# Patient Record
Sex: Female | Born: 2000 | Race: Black or African American | Hispanic: No | Marital: Single | Smoking: Never smoker
Health system: Southern US, Community
[De-identification: ages and names within clinical notes are randomized; demographics above are authoritative.]

## PROBLEM LIST (undated history)

## (undated) DIAGNOSIS — D649 Anemia, unspecified: Secondary | ICD-10-CM

## (undated) HISTORY — DX: Anemia, unspecified: D64.9

---
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
## 2022-04-02 ENCOUNTER — Ambulatory Visit (INDEPENDENT_AMBULATORY_CARE_PROVIDER_SITE_OTHER): Payer: Medicaid Other

## 2022-04-02 DIAGNOSIS — Z3201 Encounter for pregnancy test, result positive: Secondary | ICD-10-CM

## 2022-04-02 LAB — POCT PREGNANCY, URINE: Preg Test, Ur: POSITIVE — AB

## 2022-04-02 NOTE — Progress Notes (Signed)
Patient dropped off urine for a pregnancy test. Urine pregnancy test positive. I called patient to review these results with her. Per patient she had a positive pregnancy test at home on April 26th. Patient denies any pain or vaginal bleeding. Per patient her LMP was 02/13/22 making her [redacted]w[redacted]d today with an EDD of 11/20/22. Patient states her OBGYN is here at the [facility] and she would like to receive prenatal care here. New OB appointment scheduled. I recommended patient begin taking a prenatal vitamin. I provided patient with safe medication list via mychart. Patient verbalized understanding and denies any other questions.  ? ?Paulina Fusi, RN ?04/02/22 ?

## 2022-04-02 NOTE — Patient Instructions (Signed)

## 2022-04-17 ENCOUNTER — Telehealth (INDEPENDENT_AMBULATORY_CARE_PROVIDER_SITE_OTHER): Payer: Medicaid Other

## 2022-04-17 DIAGNOSIS — Z3480 Encounter for supervision of other normal pregnancy, unspecified trimester: Secondary | ICD-10-CM | POA: Insufficient documentation

## 2022-04-17 DIAGNOSIS — Z3A00 Weeks of gestation of pregnancy not specified: Secondary | ICD-10-CM

## 2022-04-17 MED ORDER — BLOOD PRESSURE MONITORING DEVI: 1.0000 | 0 refills | Status: DC

## 2022-04-17 MED ORDER — PRENATAL PLUS 27-1 MG PO TABS: 1.0000 | ORAL_TABLET | Freq: Every day | ORAL | 11 refills | Status: DC

## 2022-04-17 NOTE — Patient Instructions (Signed)

## 2022-04-17 NOTE — Progress Notes (Signed)
New OB Intake  I connected with  Ritta Slot on 04/17/22 at  2:15 PM EDT by MyChart Video Visit and verified that I am speaking with the correct person using two identifiers. Nurse is located at [facility] and pt is located at [facility].  I discussed the limitations, risks, security and privacy concerns of performing an evaluation and management service by telephone and the availability of in person appointments. I also discussed with the patient that there may be a patient responsible charge related to this service. The patient expressed understanding and agreed to proceed.  I explained I am completing New OB Intake today. We discussed her EDD of 11/20/22 that is based on LMP of 02/13/22. Pt is G0/P0. I reviewed her allergies, medications, Medical/Surgical/OB history, and appropriate screenings. I informed her of [facility] services. Based on history, this is a/an  pregnancy uncomplicated .   There are no problems to display for this patient.   Concerns addressed today  Delivery Plans:  Plans to deliver at [facility].   MyChart/Babyscripts MyChart access verified. I explained pt will have some visits in office and some virtually. Babyscripts instructions given and order placed. Patient verifies receipt of registration text/e-mail. Account successfully created and app downloaded.  Blood Pressure Cuff  Blood pressure cuff ordered for patient to pick-up from [employer]. Explained after first prenatal appt pt will check weekly and document in Babyscripts.  Weight scale: Patient does / does not  have weight scale. Weight scale ordered for patient to pick up from [employer].   Anatomy US Explained first scheduled [place] will be around 19 weeks. Anatomy US scheduled for 06/26/22 at 0830. Pt notified to arrive at 0815. Scheduled AFP lab only appointment if CenteringPregnancy pt for same day as anatomy US.   Labs Discussed Avelina Laine genetic screening with patient. Would like both [facility] and [facility]  drawn at new OB visit.Also if interested in genetic testing, tell patient she will need AFP 15-21 weeks to complete genetic testing .Routine prenatal labs needed.  Covid Vaccine Patient has not covid vaccine.   Is patient a CenteringPregnancy candidate?  Pt wanted MOM/Baby Declined due to    Not a candidate due to Is patient a CenteringPregnancy candidate?   "Centering Patient" indicated on sticky note   Is patient a Mom+Baby Combined Care candidate?  Accepted    Scheduled with Mom+Baby provider    Is patient interested in Waterbirth?  No   "Interested in [employer] - Schedule next visit with CNM" on sticky note  Informed patient of [facility] website  and placed link in her AVS.   Social Determinants of Health Food Insecurity: Patient denies food insecurity. WIC Referral: Patient is interested in referral to [facility].  Transportation: Patient denies transportation needs. Childcare: Discussed no children allowed at ultrasound appointments. Offered childcare services; patient declines childcare services at this time.  Send link to Pregnancy Navigators   Placed OB Box on problem list and updated  First visit review I reviewed new OB appt with pt. I explained she will have a pelvic exam, ob bloodwork with genetic screening, and PAP smear. Explained pt will be seen by Dr.Eckstat  at first visit; encounter routed to appropriate provider. Explained that patient will be seen by pregnancy navigator following visit with provider. [facility] information placed in AVS.   Henrietta Dine, CMA 04/17/2022  2:25 PM

## 2022-05-16 ENCOUNTER — Encounter: Payer: Self-pay | Admitting: Family Medicine

## 2022-05-16 ENCOUNTER — Ambulatory Visit (INDEPENDENT_AMBULATORY_CARE_PROVIDER_SITE_OTHER): Payer: Medicaid Other | Admitting: Family Medicine

## 2022-05-16 VITALS — BP 126/82 | HR 101 | Wt 112.8 lb

## 2022-05-16 DIAGNOSIS — Z3A13 13 weeks gestation of pregnancy: Secondary | ICD-10-CM

## 2022-05-16 DIAGNOSIS — Z3480 Encounter for supervision of other normal pregnancy, unspecified trimester: Secondary | ICD-10-CM

## 2022-05-16 NOTE — Progress Notes (Signed)
Subjective:   Lori Silva is a 21 y.o. G1P0000 at [redacted]w[redacted]d by sure LMP being seen today for her first obstetrical visit.  Her obstetrical history is significant for  n/a . Patient does intend to breast feed. Pregnancy history fully reviewed.  Patient reports no complaints.  HISTORY: OB History  Gravida Para Term Preterm AB Living  1 0 0 0 0 0  SAB IAB Ectopic Multiple Live Births  0 0 0 0 0    # Outcome Date GA Lbr Len/2nd Weight Sex Delivery Anes PTL Lv  1 Current              Last pap smear: No results found for: "DIAGPAP", "HPV", "HPVHIGH" N/a  Past Medical History:  Diagnosis Date   Anemia    Asthma    History reviewed. No pertinent surgical history. Family History  Problem Relation Age of Onset   Healthy Mother    Social History   Tobacco Use   Smoking status: Never   Smokeless tobacco: Never  Substance Use Topics   Alcohol use: Never   Drug use: Not Currently   Allergies  Allergen Reactions   Fruit & Vegetable Daily [Nutritional Supplements]     ALL fruits    Pumpkin Flavor    Current Outpatient Medications on File Prior to Visit  Medication Sig Dispense Refill   Blood Pressure Monitoring DEVI 1 each by Does not apply route once a week. 1 each 0   Prenatal Vit-Fe Fumarate-FA (MULTIVITAMIN-PRENATAL) 27-0.8 MG TABS tablet Take 1 tablet by mouth daily at 12 noon.     prenatal vitamin w/FE, FA (PRENATAL 1 + 1) 27-1 MG TABS tablet Take 1 tablet by mouth daily at 12 noon. 30 tablet 11   No current facility-administered medications on file prior to visit.     Exam   Vitals:   05/16/22 0845  BP: 126/82  Pulse: (!) 101  Weight: 112 lb 12.8 oz (51.2 kg)   Fetal Heart Rate (bpm): 155  System: General: well-developed, well-nourished female in no acute distress   Skin: normal coloration and turgor, no rashes   Neurologic: oriented, normal, negative, normal mood   Extremities: normal strength, tone, and muscle mass, ROM of all joints is normal    HEENT PERRLA, extraocular movement intact and sclera clear, anicteric   Neck supple and no masses   Respiratory:  no respiratory distress      Assessment:   Pregnancy: G1P0000 Patient Active Problem List   Diagnosis Date Noted   Supervision of other normal pregnancy, antepartum 04/17/2022     Plan:  1. Supervision of other normal pregnancy, antepartum Initial labs drawn. Continue prenatal vitamins. Genetic Screening discussed, NIPS: ordered. Ultrasound discussed; fetal anatomic survey: ordered. Problem list reviewed and updated. The nature of [facility]Family Care clinic was explained to patient; Voiced they may need to be seen by other [facility] providers which includes family medicine physicians, OB GYNs, and APPs. Delivery will hopefully be with one of the [facility] providers or another [facility] Medicine physician and we cannot promise this at this time.  Discussed there are [facility] staff in the hospital 24-7 and they understand and support this model and there is a likelihood one of these providers will catch their baby.  We also discussed that the service includes learners (residents, student) and they will be involved in the care team.     Routine obstetric precautions reviewed. Return in 4 weeks (on 06/13/2022) for [facility] patient, ob visit.

## 2022-05-16 NOTE — Patient Instructions (Signed)
Second Trimester of Pregnancy  The second trimester of pregnancy is from week 13 through week 27. This is months 4 through 6 of pregnancy. The second trimester is often a time when you feel your best. Your body has adjusted to being pregnant, and you begin to feel better physically. During the second trimester: Morning sickness has lessened or stopped completely. You may have more energy. You may have an increase in appetite. The second trimester is also a time when the unborn baby (fetus) is growing rapidly. At the end of the sixth month, the fetus may be up to 12 inches long and weigh about 1 pounds. You will likely begin to feel the baby move (quickening) between 16 and 20 weeks of pregnancy. Body changes during your second trimester Your body continues to go through many changes during your second trimester. The changes vary and generally return to normal after the baby is born. Physical changes Your weight will continue to increase. You will notice your lower abdomen bulging out. You may begin to get stretch marks on your hips, abdomen, and breasts. Your breasts will continue to grow and to become tender. Dark spots or blotches (chloasma or mask of pregnancy) may develop on your face. A dark line from your belly button to the pubic area (linea nigra) may appear. You may have changes in your hair. These can include thickening of your hair, rapid growth, and changes in texture. Some people also have hair loss during or after pregnancy, or hair that feels dry or thin. Health changes You may develop headaches. You may have heartburn. You may develop constipation. You may develop hemorrhoids or swollen, bulging veins (varicose veins). Your gums may bleed and may be sensitive to brushing and flossing. You may urinate more often because the fetus is pressing on your bladder. You may have back pain. This is caused by: Weight gain. Pregnancy hormones that are relaxing the joints in your  pelvis. A shift in weight and the muscles that support your balance. Follow these instructions at home: Medicines Follow your health care provider's instructions regarding medicine use. Specific medicines may be either safe or unsafe to take during pregnancy. Do not take any medicines unless approved by your health care provider. Take a prenatal vitamin that contains at least 600 micrograms (mcg) of folic acid. Eating and drinking Eat a healthy diet that includes fresh fruits and vegetables, whole grains, good sources of protein such as meat, eggs, or tofu, and low-fat dairy products. Avoid raw meat and unpasteurized juice, milk, and cheese. These carry germs that can harm you and your baby. You may need to take these actions to prevent or treat constipation: Drink enough fluid to keep your urine pale yellow. Eat foods that are high in fiber, such as beans, whole grains, and fresh fruits and vegetables. Limit foods that are high in fat and processed sugars, such as fried or sweet foods. Activity Exercise only as directed by your health care provider. Most people can continue their usual exercise routine during pregnancy. Try to exercise for 30 minutes at least 5 days a week. Stop exercising if you develop contractions in your uterus. Stop exercising if you develop pain or cramping in the lower abdomen or lower back. Avoid exercising if it is very hot or humid or if you are at a high altitude. Avoid heavy lifting. If you choose to, you may have sex unless your health care provider tells you not to. Relieving pain and discomfort Wear a supportive   bra to prevent discomfort from breast tenderness. Take warm sitz baths to soothe any pain or discomfort caused by hemorrhoids. Use hemorrhoid cream if your health care provider approves. Rest with your legs raised (elevated) if you have leg cramps or low back pain. If you develop varicose veins: Wear support hose as told by your health care  provider. Elevate your feet for 15 minutes, 3-4 times a day. Limit salt in your diet. Safety Wear your seat belt at all times when driving or riding in a car. Talk with your health care provider if someone is verbally or physically abusive to you. Lifestyle Do not use hot tubs, steam rooms, or saunas. Do not douche. Do not use tampons or scented sanitary pads. Avoid cat litter boxes and soil used by cats. These carry germs that can cause birth defects in the baby and possibly loss of the fetus by miscarriage or stillbirth. Do not use herbal remedies, alcohol, illegal drugs, or medicines that are not approved by your health care provider. Chemicals in these products can harm your baby. Do not use any products that contain nicotine or tobacco, such as cigarettes, e-cigarettes, and chewing tobacco. If you need help quitting, ask your health care provider. General instructions During a routine prenatal visit, your health care provider will do a physical exam and other tests. He or she will also discuss your overall health. Keep all follow-up visits. This is important. Ask your health care provider for a referral to a local prenatal education class. Ask for help if you have counseling or nutritional needs during pregnancy. Your health care provider can offer advice or refer you to specialists for help with various needs. Where to find more information American Pregnancy Association: americanpregnancy.org American College of Obstetricians and Gynecologists: acog.org/en/Womens%20Health/Pregnancy Office on Women's Health: womenshealth.gov/pregnancy Contact a health care provider if you have: A headache that does not go away when you take medicine. Vision changes or you see spots in front of your eyes. Mild pelvic cramps, pelvic pressure, or nagging pain in the abdominal area. Persistent nausea, vomiting, or diarrhea. A bad-smelling vaginal discharge or foul-smelling urine. Pain when you  urinate. Sudden or extreme swelling of your face, hands, ankles, feet, or legs. A fever. Get help right away if you: Have fluid leaking from your vagina. Have spotting or bleeding from your vagina. Have severe abdominal cramping or pain. Have difficulty breathing. Have chest pain. Have fainting spells. Have not felt your baby move for the time period told by your health care provider. Have new or increased pain, swelling, or redness in an arm or leg. Summary The second trimester of pregnancy is from week 13 through week 27 (months 4 through 6). Do not use herbal remedies, alcohol, illegal drugs, or medicines that are not approved by your health care provider. Chemicals in these products can harm your baby. Exercise only as directed by your health care provider. Most people can continue their usual exercise routine during pregnancy. Keep all follow-up visits. This is important. This information is not intended to replace advice given to you by your health care provider. Make sure you discuss any questions you have with your health care provider. Document Revised: 04/25/2020 Document Reviewed: 03/01/2020 Elsevier Patient Education  2023 Else[employer] Inc.  Contraception Choices Contraception, also called birth control, refers to methods or devices that prevent pregnancy. Hormonal methods  Contraceptive implant A contraceptive implant is a thin, plastic tube that contains a hormone that prevents pregnancy. It is different from an intrauterine device (  IUD). It is inserted into the upper part of the arm by a health care provider. Implants can be effective for up to 3 years. Progestin-only injections Progestin-only injections are injections of progestin, a synthetic form of the hormone progesterone. They are given every 3 months by a health care provider. Birth control pills Birth control pills are pills that contain hormones that prevent pregnancy. They must be taken once a day, preferably at  the same time each day. A prescription is needed to use this method of contraception. Birth control patch The birth control patch contains hormones that prevent pregnancy. It is placed on the skin and must be changed once a week for three weeks and removed on the fourth week. A prescription is needed to use this method of contraception. Vaginal ring A vaginal ring contains hormones that prevent pregnancy. It is placed in the vagina for three weeks and removed on the fourth week. After that, the process is repeated with a new ring. A prescription is needed to use this method of contraception. Emergency contraceptive Emergency contraceptives prevent pregnancy after unprotected sex. They come in pill form and can be taken up to 5 days after sex. They work best the sooner they are taken after having sex. Most emergency contraceptives are available without a prescription. This method should not be used as your only form of birth control. Barrier methods  Female condom A female condom is a thin sheath that is worn over the penis during sex. Condoms keep sperm from going inside a woman's body. They can be used with a sperm-killing substance (spermicide) to increase their effectiveness. They should be thrown away after one use. Female condom A female condom is a soft, loose-fitting sheath that is put into the vagina before sex. The condom keeps sperm from going inside a woman's body. They should be thrown away after one use. Diaphragm A diaphragm is a soft, dome-shaped barrier. It is inserted into the vagina before sex, along with a spermicide. The diaphragm blocks sperm from entering the uterus, and the spermicide kills sperm. A diaphragm should be left in the vagina for 6-8 hours after sex and removed within 24 hours. A diaphragm is prescribed and fitted by a health care provider. A diaphragm should be replaced every 1-2 years, after giving birth, after gaining more than 15 lb (6.8 kg), and after pelvic  surgery. Cervical cap A cervical cap is a round, soft latex or plastic cup that fits over the cervix. It is inserted into the vagina before sex, along with spermicide. It blocks sperm from entering the uterus. The cap should be left in place for 6-8 hours after sex and removed within 48 hours. A cervical cap must be prescribed and fitted by a health care provider. It should be replaced every 2 years. Sponge A sponge is a soft, circular piece of polyurethane foam with spermicide in it. The sponge helps block sperm from entering the uterus, and the spermicide kills sperm. To use it, you make it wet and then insert it into the vagina. It should be inserted before sex, left in for at least 6 hours after sex, and removed and thrown away within 30 hours. Spermicides Spermicides are chemicals that kill or block sperm from entering the cervix and uterus. They can come as a cream, jelly, suppository, foam, or tablet. A spermicide should be inserted into the vagina with an applicator at least 10-15 minutes before sex to allow time for it to work. The process must be   repeated every time you have sex. Spermicides do not require a prescription. Intrauterine contraception Intrauterine device (IUD) An IUD is a T-shaped device that is put in a woman's uterus. There are two types: Hormone IUD.This type contains progestin, a synthetic form of the hormone progesterone. This type can stay in place for 3-5 years. Copper IUD.This type is wrapped in copper wire. It can stay in place for 10 years. Permanent methods of contraception Female tubal ligation In this method, a woman's fallopian tubes are sealed, tied, or blocked during surgery to prevent eggs from traveling to the uterus. Hysteroscopic sterilization In this method, a small, flexible insert is placed into each fallopian tube. The inserts cause scar tissue to form in the fallopian tubes and block them, so sperm cannot reach an egg. The procedure takes about 3  months to be effective. Another form of birth control must be used during those 3 months. Female sterilization This is a procedure to tie off the tubes that carry sperm (vasectomy). After the procedure, the man can still ejaculate fluid (semen). Another form of birth control must be used for 3 months after the procedure. Natural planning methods Natural family planning In this method, a couple does not have sex on days when the woman could become pregnant. Calendar method In this method, the woman keeps track of the length of each menstrual cycle, identifies the days when pregnancy can happen, and does not have sex on those days. Ovulation method In this method, a couple avoids sex during ovulation. Symptothermal method This method involves not having sex during ovulation. The woman typically checks for ovulation by watching changes in her temperature and in the consistency of cervical mucus. Post-ovulation method In this method, a couple waits to have sex until after ovulation. Where to find more information Centers for Disease Control and Prevention: www.cdc.gov Summary Contraception, also called birth control, refers to methods or devices that prevent pregnancy. Hormonal methods of contraception include implants, injections, pills, patches, vaginal rings, and emergency contraceptives. Barrier methods of contraception can include female condoms, female condoms, diaphragms, cervical caps, sponges, and spermicides. There are two types of IUDs (intrauterine devices). An IUD can be put in a woman's uterus to prevent pregnancy for 3-5 years. Permanent sterilization can be done through a procedure for males and females. Natural family planning methods involve nothaving sex on days when the woman could become pregnant. This information is not intended to replace advice given to you by your health care provider. Make sure you discuss any questions you have with your health care provider. Document  Revised: 04/23/2020 Document Reviewed: 04/23/2020 Elsevier Patient Education  2023 Elsevier Inc.  

## 2022-05-17 LAB — CBC/D/PLT+RPR+RH+ABO+RUBIGG...
Antibody Screen: NEGATIVE
Basophils Absolute: 0 10*3/uL (ref 0.0–0.2)
Basos: 0 %
EOS (ABSOLUTE): 0.1 10*3/uL (ref 0.0–0.4)
Eos: 1 %
HCV Ab: NONREACTIVE
HIV Screen 4th Generation wRfx: NONREACTIVE
Hematocrit: 34.8 % (ref 34.0–46.6)
Hemoglobin: 11.1 g/dL (ref 11.1–15.9)
Hepatitis B Surface Ag: NEGATIVE
Immature Grans (Abs): 0 10*3/uL (ref 0.0–0.1)
Immature Granulocytes: 0 %
Lymphocytes Absolute: 1.7 10*3/uL (ref 0.7–3.1)
Lymphs: 21 %
MCH: 25.5 pg — ABNORMAL LOW (ref 26.6–33.0)
MCHC: 31.9 g/dL (ref 31.5–35.7)
MCV: 80 fL (ref 79–97)
Monocytes Absolute: 0.5 10*3/uL (ref 0.1–0.9)
Monocytes: 6 %
Neutrophils Absolute: 5.8 10*3/uL (ref 1.4–7.0)
Neutrophils: 72 %
Platelets: 297 10*3/uL (ref 150–450)
RBC: 4.35 x10E6/uL (ref 3.77–5.28)
RDW: 13.4 % (ref 11.7–15.4)
RPR Ser Ql: NONREACTIVE
Rh Factor: POSITIVE
Rubella Antibodies, IGG: 3.54 index (ref >0.99)
WBC: 8 10*3/uL (ref 3.4–10.8)

## 2022-05-17 LAB — HEMOGLOBIN A1C
Est. average glucose Bld gHb Est-mCnc: 100 mg/dL
Hgb A1c MFr Bld: 5.1 % (ref 4.8–5.6)

## 2022-05-17 LAB — HCV INTERPRETATION

## 2022-05-18 LAB — CULTURE, OB URINE

## 2022-05-18 LAB — URINE CULTURE, OB REFLEX

## 2022-05-19 ENCOUNTER — Encounter: Payer: Self-pay | Admitting: *Deleted

## 2022-05-31 ENCOUNTER — Encounter: Payer: Self-pay | Admitting: Family Medicine

## 2022-06-01 ENCOUNTER — Encounter (HOSPITAL_COMMUNITY): Payer: Self-pay | Admitting: Family Medicine

## 2022-06-01 ENCOUNTER — Inpatient Hospital Stay (HOSPITAL_BASED_OUTPATIENT_CLINIC_OR_DEPARTMENT_OTHER): Payer: Medicaid Other

## 2022-06-01 ENCOUNTER — Inpatient Hospital Stay (HOSPITAL_COMMUNITY)
Admission: AD | Admit: 2022-06-01 | Discharge: 2022-06-01 | Disposition: A | Discharge status: Home / Self Care | Payer: Medicaid Other | Attending: Family Medicine | Admitting: Family Medicine

## 2022-06-01 DIAGNOSIS — O4692 Antepartum hemorrhage, unspecified, second trimester: Secondary | ICD-10-CM

## 2022-06-01 DIAGNOSIS — O99012 Anemia complicating pregnancy, second trimester: Secondary | ICD-10-CM

## 2022-06-01 DIAGNOSIS — Z6790 Unspecified blood type, Rh positive: Secondary | ICD-10-CM

## 2022-06-01 DIAGNOSIS — Z3A15 15 weeks gestation of pregnancy: Secondary | ICD-10-CM

## 2022-06-01 DIAGNOSIS — O209 Hemorrhage in early pregnancy, unspecified: Secondary | ICD-10-CM | POA: Insufficient documentation

## 2022-06-01 DIAGNOSIS — O321XX0 Maternal care for breech presentation, not applicable or unspecified: Secondary | ICD-10-CM | POA: Diagnosis not present

## 2022-06-01 DIAGNOSIS — O99019 Anemia complicating pregnancy, unspecified trimester: Secondary | ICD-10-CM

## 2022-06-01 LAB — CBC
HCT: 31.7 % — ABNORMAL LOW (ref 36.0–46.0)
Hemoglobin: 9.8 g/dL — ABNORMAL LOW (ref 12.0–15.0)
MCH: 25.6 pg — ABNORMAL LOW (ref 26.0–34.0)
MCHC: 30.9 g/dL (ref 30.0–36.0)
MCV: 82.8 fL (ref 80.0–100.0)
Platelets: 283 10*3/uL (ref 150–400)
RBC: 3.83 MIL/uL — ABNORMAL LOW (ref 3.87–5.11)
RDW: 14.3 % (ref 11.5–15.5)
WBC: 6.8 10*3/uL (ref 4.0–10.5)
nRBC: 0 % (ref 0.0–0.2)

## 2022-06-01 MED ORDER — POLYSACCHARIDE IRON COMPLEX 150 MG PO CAPS: 150.0000 mg | ORAL_CAPSULE | ORAL | 0 refills | Status: DC

## 2022-06-01 MED ORDER — DOCUSATE SODIUM 100 MG PO CAPS: 100.0000 mg | ORAL_CAPSULE | Freq: Two times a day (BID) | ORAL | 2 refills | Status: DC | PRN

## 2022-06-01 NOTE — Discharge Instructions (Signed)

## 2022-06-01 NOTE — MAU Note (Signed)
Lori Silva is a 21 y.o. at [redacted]w[redacted]d here in MAU reporting: yesterday around 8p.m. had some light pink spotting.  When she woke up around 1030, there was dark red blood, was in her shorts and when she wiped. . No pain. Denies recent intercourse. Has not had an [place] Onset of complaint: last night Pain score: no Vitals:   06/01/22 1150  BP: 119/86  Pulse: 89  Resp: 17  Temp: 99.2 F (37.3 C)  SpO2: 100%     FHT:142 Lab orders placed from triage:  none

## 2022-06-01 NOTE — MAU Provider Note (Signed)
History     CSN: [id number]  Arrival date and time: 06/01/22 1139   Event Date/Time   First Provider Initiated Contact with Patient 06/01/22 1317      Chief Complaint  Patient presents with   Vaginal Bleeding   HPI Lori Silva is a 21 y.o. G1P0000 at 468w3d who presents to MAU with chief complaint of vaginal bleeding. Patient reports new onset light pink spotting yesterday 05/31/2022. This morning at 1030 she visualized dark red blood in her clothing and when she wiped after voiding. Pain score is 0/10. She is remote from intercourse. Patient states she has not had any ultrasound in her current pregnancy.  Patient receives care with [facility]  OB History     Gravida  1   Para  0   Term  0   Preterm  0   AB  0   Living  0      SAB  0   IAB  0   Ectopic  0   Multiple  0   Live Births  0           Past Medical History:  Diagnosis Date   Anemia    Asthma     History reviewed. No pertinent surgical history.  Family History  Problem Relation Age of Onset   Healthy Mother     Social History   Tobacco Use   Smoking status: Never   Smokeless tobacco: Never  Substance Use Topics   Alcohol use: Never   Drug use: Not Currently    Allergies:  Allergies  Allergen Reactions   Fruit & Vegetable Daily [Nutritional Supplements]     ALL fruits    Pumpkin Flavor     Medications Prior to Admission  Medication Sig Dispense Refill Last Dose   prenatal vitamin w/FE, FA (PRENATAL 1 + 1) 27-1 MG TABS tablet Take 1 tablet by mouth daily at 12 noon. 30 tablet 11 Past Week   Blood Pressure Monitoring DEVI 1 each by Does not apply route once a week. 1 each 0    Prenatal Vit-Fe Fumarate-FA (MULTIVITAMIN-PRENATAL) 27-0.8 MG TABS tablet Take 1 tablet by mouth daily at 12 noon.       Review of Systems  Genitourinary:  Positive for vaginal bleeding.  All other systems reviewed and are negative.  Physical Exam   Blood pressure 119/86, pulse 89,  temperature 99.2 F (37.3 C), temperature source Oral, resp. rate 17, height 5\' 3"  (1.6 m), weight 51.8 kg, last menstrual period 02/13/2022, SpO2 100 %.  Physical Exam Vitals and nursing note reviewed. Exam conducted with a chaperone present.  Constitutional:      Appearance: Normal appearance. She is not ill-appearing.  Cardiovascular:     Rate and Rhythm: Normal rate and regular rhythm.     Pulses: Normal pulses.     Heart sounds: Normal heart sounds.  Pulmonary:     Effort: Pulmonary effort is normal.     Breath sounds: Normal breath sounds.  Abdominal:     Tenderness: There is no guarding or rebound.  Genitourinary:    Comments: Pelvic exam: External genitalia normal, vaginal walls pink and well rugated, cervix visually closed, no lesions noted. No active bleeding observed. Negligible dark red blood on tip of speculum.   Skin:    Capillary Refill: Capillary refill takes less than 2 seconds.  Neurological:     Mental Status: She is alert and oriented to person, place, and time.  Psychiatric:  Mood and Affect: Mood normal.        Behavior: Behavior normal.        Thought Content: Thought content normal.        Judgment: Judgment normal.     MAU Course  Procedures  MDM Orders Placed This Encounter  Procedures   [place] MFM OB Comp + 14 Weeks   CBC   Discharge patient   Patient Vitals for the past 24 hrs:  BP Temp Temp src Pulse Resp SpO2 Height Weight  06/01/22 1342 99/66 98.1 F (36.7 C) Oral 84 -- -- -- --  06/01/22 1203 119/71 -- -- 82 -- -- -- --  06/01/22 1150 119/86 99.2 F (37.3 C) Oral 89 17 100 % 5\' 3"  (1.6 m) 51.8 kg   Results for orders placed or performed during the hospital encounter of 06/01/22 (from the past 24 hour(s))  CBC     Status: Abnormal   Collection Time: 06/01/22 12:02 PM  Result Value Ref Range   WBC 6.8 4.0 - 10.5 K/uL   RBC 3.83 (L) 3.87 - 5.11 MIL/uL   Hemoglobin 9.8 (L) 12.0 - 15.0 g/dL   HCT 08/02/22 (L) 66.4 - 40.3 %   MCV 82.8  80.0 - 100.0 fL   MCH 25.6 (L) 26.0 - 34.0 pg   MCHC 30.9 30.0 - 36.0 g/dL   RDW 47.4 25.9 - 56.3 %   Platelets 283 150 - 400 K/uL   nRBC 0.0 0.0 - 0.2 %   87.5 MFM OB Comp + 14 Weeks  Result Date: 06/01/2022 ----------------------------------------------------------------------  OBSTETRICS REPORT                        (Signed Final 06/01/2022 10:38 pm) ---------------------------------------------------------------------- Patient Info  ID #:       08/02/2022                          D.O.B.:  24-Dec-2000 (20 yrs)  Name:       [birth year]                 Visit Date: 06/01/2022 01:04 pm ---------------------------------------------------------------------- Performed By  Attending:        08/02/2022      Referred By:       Lin Landsman                    MD                                        Ivannia Willhelm  Performed By:     Roxy Cedar BS,      [facility] ---------------------------------------------------------------------- Orders  #  Description                           Code        Ordered By  1  Emeline Darling MFM OB COMP +  14 WK                76805.01    Clayton Bibles ----------------------------------------------------------------------  #  Order #                     Accession #                Episode #  1  [id number]                   [id number]                 [id number] ---------------------------------------------------------------------- Indications  Vaginal bleeding in pregnancy, second           O46.92  trimester  Anemia during pregnancy in second trimester     O99.012  [redacted] weeks gestation of pregnancy                 Z3A.15 ---------------------------------------------------------------------- Fetal Evaluation  Num Of Fetuses:          1  Fetal Heart Rate(bpm):   151  Cardiac Activity:        Observed  Presentation:            Breech  Placenta:                 Posterior  P. Cord Insertion:       Visualized  Amniotic Fluid  AFI FV:      Within normal limits                              Largest Pocket(cm)                              3.2  Comment:    No placental abruption or previa identified. ---------------------------------------------------------------------- Biometry  BPD:     33.45  mm     G. Age:  16w 3d         86  %    CI:        77.41   %    70 - 86                                                          FL/HC:       16.1  %    15.3 - 17.1  HC:    120.36   mm     G. Age:  16w 0d         65  %    HC/AC:       1.15       1.05 - 1.39  AC:    104.44   mm     G. Age:  16w 3d         83  %    FL/BPD:      57.9  %  FL:  19.38  mm     G. Age:  15w 5d         59  %    FL/AC:       18.6  %    20 - 24  HUM:      19.7  mm     G. Age:  15w 6d         66  %  Est. FW:     144   gm     0 lb 5 oz     82  % ---------------------------------------------------------------------- OB History  Gravidity:    1 ---------------------------------------------------------------------- Gestational Age  LMP:           15w 3d        Date:  02/13/22                  EDD:   11/20/22  U/S Today:     16w 1d                                        EDD:   11/15/22  Best:          15w 3d     Det. By:  LMP  (02/13/22)          EDD:   11/20/22 ---------------------------------------------------------------------- Anatomy  Cranium:               Appears normal         Aortic Arch:            Not well visualized  Cavum:                 Not well visualized    Ductal Arch:            Not well visualized  Ventricles:            Appears normal         Diaphragm:              Appears normal  Choroid Plexus:        Appears normal         Stomach:                Appears normal, left                                                                        sided  Cerebellum:            Not well visualized    Abdomen:                Appears normal  Posterior Fossa:       Not well visualized    Abdominal  Wall:         Appears nml (cord  insert, abd wall)  Nuchal Fold:           Not applicable (< 16   Cord Vessels:           Appears normal ([redacted]                         wks GA)                                        vessel cord)  Face:                  Orbits appear          Kidneys:                Appear normal                         normal  Lips:                  Not well visualized    Bladder:                Appears normal  Thoracic:              Appears normal         Spine:                  Not well visualized  Heart:                 Not well visualized    Upper Extremities:      Appears normal  RVOT:                  Not well visualized    Lower Extremities:      Appears normal  LVOT:                  Not well visualized  Other:  Technically difficult due to early gestational age. ---------------------------------------------------------------------- Cervix Uterus Adnexa  Cervix  Length:              3  cm.  Normal appearance by transabdominal scan.  Right Ovary  Within normal limits.  Left Ovary  Within normal limits.  Adnexa  No abnormality visualized. ---------------------------------------------------------------------- Impression  Single intrauterine pregnancy here for a complete anatomy  due to vaginal bleeding.  Normal anatomy with measurements consistent with dates  There is good fetal movement and amniotic fluid volume  Suboptimal views of the fetal anatomy were obtained  secondary to fetal position. ---------------------------------------------------------------------- Recommendations  Follow up growth and anatomy in 4 weeks. ----------------------------------------------------------------------              Lin Landsman, MD Electronically Signed Final Report   06/01/2022 10:38 pm ----------------------------------------------------------------------    Assessment and Plan  --21 y.o. G1P0000 at [redacted]w[redacted]d  --Bleeding in second  trimester --No blood on initial assessment, no additional bleeding during encounter --No acute findings on MFM OB US --Posterior placenta --Blood type A POS --Hgb 9.8, initiate PO FE every other day --Discharge home in stable condition  Calvert Cantor, MSA, MSN, CNM

## 2022-06-06 ENCOUNTER — Telehealth: Payer: Self-pay

## 2022-06-06 NOTE — Telephone Encounter (Signed)
CMA attempt to reach patient to inform that her [employer] genetic screening result will be upload in her MyChart. If patient do not want to know the baby's gender do not click on the lab result on MyChart.  No answer and Lvm. Will send patient a MyChart message.  Felecia Shelling  CMA

## 2022-06-06 NOTE — Telephone Encounter (Signed)
Pt unable to be contacted by CMA this AM. MyChart messsage sent vs phone call.

## 2022-06-10 ENCOUNTER — Encounter: Payer: Self-pay | Admitting: Medical

## 2022-06-10 ENCOUNTER — Ambulatory Visit (INDEPENDENT_AMBULATORY_CARE_PROVIDER_SITE_OTHER): Payer: Medicaid Other | Admitting: Medical

## 2022-06-10 VITALS — BP 115/79 | HR 112 | Wt 116.8 lb

## 2022-06-10 DIAGNOSIS — Z3480 Encounter for supervision of other normal pregnancy, unspecified trimester: Secondary | ICD-10-CM

## 2022-06-10 DIAGNOSIS — O99012 Anemia complicating pregnancy, second trimester: Secondary | ICD-10-CM

## 2022-06-10 DIAGNOSIS — D563 Thalassemia minor: Secondary | ICD-10-CM | POA: Insufficient documentation

## 2022-06-10 DIAGNOSIS — Z3A16 16 weeks gestation of pregnancy: Secondary | ICD-10-CM

## 2022-06-10 NOTE — Patient Instructions (Signed)

## 2022-06-10 NOTE — Progress Notes (Signed)
   PRENATAL VISIT NOTE  Subjective:  Lori Silva is a 21 y.o. G1P0000 at [redacted]w[redacted]d being seen today for ongoing prenatal care.  She is currently monitored for the following issues for this low-risk pregnancy and has Supervision of other normal pregnancy, antepartum; Anemia in pregnancy; and Alpha thalassemia silent carrier on their problem list.  Patient reports no complaints.  Contractions: Not present. Vag. Bleeding: None.  Movement: Present. Denies leaking of fluid.   The following portions of the patient's history were reviewed and updated as appropriate: allergies, current medications, past family history, past medical history, past social history, past surgical history and problem list.   Objective:   Vitals:   06/10/22 1506  BP: 115/79  Pulse: (!) 112  Weight: 116 lb 12.8 oz (53 kg)    Fetal Status: Fetal Heart Rate (bpm): 148   Movement: Present     General:  Alert, oriented and cooperative. Patient is in no acute distress.  Skin: Skin is warm and dry. No rash noted.   Cardiovascular: Normal heart rate noted  Respiratory: Normal respiratory effort, no problems with respiration noted  Abdomen: Soft, gravid, appropriate for gestational age.  Pain/Pressure: Absent     Pelvic: Cervical exam deferred        Extremities: Normal range of motion.     Mental Status: Normal mood and affect. Normal behavior. Normal judgment and thought content.   Assessment and Plan:  Pregnancy: G1P0000 at [redacted]w[redacted]d 1. Supervision of other normal pregnancy, antepartum - AFP, Serum, Open Spina Bifida - CBE given  - Anatomy US scheduled 06/26/22  2. Anemia during pregnancy in second trimester - Taking QOD supplement without issue   3. Alpha thalassemia silent carrier - Discussed results and offered partner testing, will consider   4. [redacted] weeks gestation of pregnancy  Preterm labor symptoms and general obstetric precautions including but not limited to vaginal bleeding, contractions, leaking of fluid  and fetal movement were reviewed in detail with the patient. Please refer to After Visit Summary for other counseling recommendations.   Return in about 4 weeks (around 07/08/2022) for LOB, In-Person, Mom+baby.  Future Appointments  Date Time Provider Department Center  06/26/2022  8:30 AM [facility] US3 [facility]  07/07/2022  9:55 AM Federico Flake, MD [facility]  08/05/2022  3:15 PM Venora Maples, MD [facility]  08/27/2022  8:50 AM [facility] LAB [facility]  08/27/2022  9:15 AM Venora Maples, MD [facility]    Vonzella Nipple, PA-C

## 2022-06-12 LAB — AFP, SERUM, OPEN SPINA BIFIDA
AFP MoM: 0.88
AFP Value: 42 ng/mL
Gest. Age on Collection Date: 16.5 weeks
Maternal Age At EDD: 19.4 yr
OSBR Risk 1 IN: 10000
Test Results:: NEGATIVE
Weight: 117 [lb_av]

## 2022-06-26 ENCOUNTER — Other Ambulatory Visit: Payer: Self-pay | Admitting: *Deleted

## 2022-06-26 ENCOUNTER — Ambulatory Visit: Payer: Medicaid Other | Attending: Family Medicine

## 2022-06-26 ENCOUNTER — Other Ambulatory Visit: Payer: Self-pay | Admitting: Family Medicine

## 2022-06-26 DIAGNOSIS — Z363 Encounter for antenatal screening for malformations: Secondary | ICD-10-CM | POA: Insufficient documentation

## 2022-06-26 DIAGNOSIS — Z3480 Encounter for supervision of other normal pregnancy, unspecified trimester: Secondary | ICD-10-CM

## 2022-06-26 DIAGNOSIS — O99012 Anemia complicating pregnancy, second trimester: Secondary | ICD-10-CM | POA: Insufficient documentation

## 2022-06-26 DIAGNOSIS — Z3A19 19 weeks gestation of pregnancy: Secondary | ICD-10-CM | POA: Insufficient documentation

## 2022-06-26 DIAGNOSIS — Z681 Body mass index (BMI) 19 or less, adult: Secondary | ICD-10-CM

## 2022-06-26 DIAGNOSIS — Z148 Genetic carrier of other disease: Secondary | ICD-10-CM | POA: Diagnosis not present

## 2022-06-26 DIAGNOSIS — D563 Thalassemia minor: Secondary | ICD-10-CM | POA: Diagnosis not present

## 2022-07-07 ENCOUNTER — Ambulatory Visit (INDEPENDENT_AMBULATORY_CARE_PROVIDER_SITE_OTHER): Payer: Medicaid Other | Admitting: Family Medicine

## 2022-07-07 VITALS — BP 106/71 | HR 88 | Wt 119.7 lb

## 2022-07-07 DIAGNOSIS — Z3480 Encounter for supervision of other normal pregnancy, unspecified trimester: Secondary | ICD-10-CM

## 2022-07-07 DIAGNOSIS — O99012 Anemia complicating pregnancy, second trimester: Secondary | ICD-10-CM

## 2022-07-07 DIAGNOSIS — D563 Thalassemia minor: Secondary | ICD-10-CM

## 2022-07-07 NOTE — Progress Notes (Signed)
    PRENATAL VISIT NOTE  Subjective:  Lori Silva is a 21 y.o. G1P0000 at [redacted]w[redacted]d being seen today for ongoing prenatal care.  She is currently monitored for the following issues for this low-risk pregnancy and has Supervision of other normal pregnancy, antepartum; Anemia in pregnancy; and Alpha thalassemia silent carrier on their problem list.  Patient reports no complaints.  Contractions: Not present. Vag. Bleeding: None.  Movement: Present. Denies leaking of fluid.   The following portions of the patient's history were reviewed and updated as appropriate: allergies, current medications, past family history, past medical history, past social history, past surgical history and problem list.   Objective:   Vitals:   07/07/22 1026  BP: 106/71  Pulse: 88  Weight: 119 lb 11.2 oz (54.3 kg)    Fetal Status: Fetal Heart Rate (bpm): 157   Movement: Present     General:  Alert, oriented and cooperative. Patient is in no acute distress.  Skin: Skin is warm and dry. No rash noted.   Cardiovascular: Normal heart rate noted  Respiratory: Normal respiratory effort, no problems with respiration noted  Abdomen: Soft, gravid, appropriate for gestational age.  Pain/Pressure: Present     Pelvic: Cervical exam deferred        Extremities: Normal range of motion.  Edema: None  Mental Status: Normal mood and affect. Normal behavior. Normal judgment and thought content.   Assessment and Plan:  Pregnancy: G1P0000 at [redacted]w[redacted]d 1. Supervision of other normal pregnancy, antepartum Up to date No concerns today Reviewed cadence of visits and timing of 3rd trimester labs.   2. Alpha thalassemia silent carrier  3. Anemia during pregnancy in second trimester 9.8 in the beginning of pregnancy, taking iron every other day. Tolerating well Await 3rd trimester labs  Preterm labor symptoms and general obstetric precautions including but not limited to vaginal bleeding, contractions, leaking of fluid and fetal  movement were reviewed in detail with the patient. Please refer to After Visit Summary for other counseling recommendations.   Return in about 4 weeks (around 08/04/2022) for Routine prenatal care, Mom+Baby Combined Care.  Future Appointments  Date Time Provider Department Center  08/05/2022  3:15 PM Venora Maples, MD [facility]  08/27/2022  8:50 AM [facility] LAB [facility]  08/27/2022  9:15 AM Venora Maples, MD [facility]  09/08/2022 10:35 AM Federico Flake, MD [facility]  09/18/2022 12:30 PM [facility] NURSE [facility]  09/18/2022 12:45 PM [facility] US4 [facility]  09/23/2022  1:35 PM Crissie Reese, Mary Sella, MD [facility]    Federico Flake, MD

## 2022-08-05 ENCOUNTER — Ambulatory Visit (INDEPENDENT_AMBULATORY_CARE_PROVIDER_SITE_OTHER): Payer: Medicaid Other | Admitting: Family Medicine

## 2022-08-05 ENCOUNTER — Encounter: Payer: Self-pay | Admitting: Family Medicine

## 2022-08-05 ENCOUNTER — Other Ambulatory Visit: Payer: Self-pay

## 2022-08-05 VITALS — BP 122/87 | HR 85 | Wt 126.2 lb

## 2022-08-05 DIAGNOSIS — O99012 Anemia complicating pregnancy, second trimester: Secondary | ICD-10-CM

## 2022-08-05 DIAGNOSIS — Z3480 Encounter for supervision of other normal pregnancy, unspecified trimester: Secondary | ICD-10-CM

## 2022-08-05 DIAGNOSIS — D563 Thalassemia minor: Secondary | ICD-10-CM

## 2022-08-05 MED ORDER — POLYSACCHARIDE IRON COMPLEX 150 MG PO CAPS: 150.0000 mg | ORAL_CAPSULE | ORAL | 5 refills | Status: DC

## 2022-08-05 NOTE — Patient Instructions (Signed)

## 2022-08-05 NOTE — Progress Notes (Signed)
Subjective:  Lori Silva is a 21 y.o. G1P0000 at 65w5dbeing seen today for ongoing prenatal care.  She is currently monitored for the following issues for this low-risk pregnancy and has Supervision of other normal pregnancy, antepartum; Anemia in pregnancy; and Alpha thalassemia silent carrier on their problem list.  Patient reports no complaints.  Contractions: Not present. Vag. Bleeding: None.  Movement: Present. Denies leaking of fluid.   The following portions of the patient's history were reviewed and updated as appropriate: allergies, current medications, past family history, past medical history, past social history, past surgical history and problem list. Problem list updated.  Objective:   Vitals:   08/05/22 1552  BP: 122/87  Pulse: 85  Weight: 126 lb 3.2 oz (57.2 kg)    Fetal Status: Fetal Heart Rate (bpm): 142   Movement: Present     General:  Alert, oriented and cooperative. Patient is in no acute distress.  Skin: Skin is warm and dry. No rash noted.   Cardiovascular: Normal heart rate noted  Respiratory: Normal respiratory effort, no problems with respiration noted  Abdomen: Soft, gravid, appropriate for gestational age. Pain/Pressure: Absent     Pelvic: Vag. Bleeding: None     Cervical exam deferred        Extremities: Normal range of motion.     Mental Status: Normal mood and affect. Normal behavior. Normal judgment and thought content.   Urinalysis:      Assessment and Plan:  Pregnancy: G1P0000 at 253w5d1. Supervision of other normal pregnancy, antepartum BP and FHR normal Dizzy spells, mostly with position changes, discussed slow changes in position to mitigate this Discussed fasting labs for next visit  2. Alpha thalassemia silent carrier Partner testing offered previously, still needs kit, provided at todays visit  3. Anemia during pregnancy in second trimester Lab Results  Component Value Date   HGB 9.8 (L) 06/01/2022   Mild, taking PO  iron, recheck CBC w 28 wk labs  Preterm labor symptoms and general obstetric precautions including but not limited to vaginal bleeding, contractions, leaking of fluid and fetal movement were reviewed in detail with the patient. Please refer to After Visit Summary for other counseling recommendations.  Return in 4 weeks (on 09/02/2022) for Dyad patient, ob visit, 28 wk labs.   EcClarnce FlockMD

## 2022-08-19 ENCOUNTER — Encounter: Payer: Self-pay | Admitting: Family Medicine

## 2022-08-19 DIAGNOSIS — Z3480 Encounter for supervision of other normal pregnancy, unspecified trimester: Secondary | ICD-10-CM

## 2022-08-19 MED ORDER — PRENATAL PLUS 27-1 MG PO TABS: 1.0000 | ORAL_TABLET | Freq: Every day | ORAL | 11 refills | Status: DC

## 2022-08-27 ENCOUNTER — Encounter: Payer: Self-pay | Admitting: Family Medicine

## 2022-08-27 ENCOUNTER — Other Ambulatory Visit: Payer: Self-pay | Admitting: General Practice

## 2022-08-27 ENCOUNTER — Ambulatory Visit (INDEPENDENT_AMBULATORY_CARE_PROVIDER_SITE_OTHER): Payer: Medicaid Other | Admitting: Family Medicine

## 2022-08-27 ENCOUNTER — Other Ambulatory Visit: Payer: Medicaid Other

## 2022-08-27 VITALS — BP 123/76 | HR 101 | Wt 128.6 lb

## 2022-08-27 DIAGNOSIS — O99013 Anemia complicating pregnancy, third trimester: Secondary | ICD-10-CM

## 2022-08-27 DIAGNOSIS — Z3480 Encounter for supervision of other normal pregnancy, unspecified trimester: Secondary | ICD-10-CM

## 2022-08-27 DIAGNOSIS — Z23 Encounter for immunization: Secondary | ICD-10-CM

## 2022-08-27 DIAGNOSIS — D563 Thalassemia minor: Secondary | ICD-10-CM

## 2022-08-27 NOTE — Progress Notes (Signed)
   Subjective:  Lori Silva is a 21 y.o. G1P0000 at [redacted]w[redacted]d being seen today for ongoing prenatal care.  She is currently monitored for the following issues for this low-risk pregnancy and has Supervision of other normal pregnancy, antepartum; Anemia in pregnancy; and Alpha thalassemia silent carrier on their problem list.  Patient reports no complaints.  Contractions: Not present. Vag. Bleeding: None.  Movement: Present. Denies leaking of fluid.   The following portions of the patient's history were reviewed and updated as appropriate: allergies, current medications, past family history, past medical history, past social history, past surgical history and problem list. Problem list updated.  Objective:   Vitals:   08/27/22 0947  BP: 123/76  Pulse: (!) 101  Weight: 128 lb 9.6 oz (58.3 kg)    Fetal Status: Fetal Heart Rate (bpm): 135   Movement: Present     General:  Alert, oriented and cooperative. Patient is in no acute distress.  Skin: Skin is warm and dry. No rash noted.   Cardiovascular: Normal heart rate noted  Respiratory: Normal respiratory effort, no problems with respiration noted  Abdomen: Soft, gravid, appropriate for gestational age. Pain/Pressure: Absent     Pelvic: Vag. Bleeding: None     Cervical exam deferred        Extremities: Normal range of motion.     Mental Status: Normal mood and affect. Normal behavior. Normal judgment and thought content.   Urinalysis:      Assessment and Plan:  Pregnancy: G1P0000 at [redacted]w[redacted]d  1. Supervision of other normal pregnancy, antepartum BP and FHR normal 28wk labs today Accepts tdap  2. Alpha thalassemia silent carrier Provided partner kit at last visit, has not yet completed it  3. Anemia during pregnancy in third trimester On PO iron, follow up CBC from today  Preterm labor symptoms and general obstetric precautions including but not limited to vaginal bleeding, contractions, leaking of fluid and fetal movement were  reviewed in detail with the patient. Please refer to After Visit Summary for other counseling recommendations.  Return in 2 weeks (on 09/10/2022) for Dyad patient, ob visit.   Clarnce Flock, MD

## 2022-08-27 NOTE — Patient Instructions (Signed)

## 2022-08-28 ENCOUNTER — Telehealth: Payer: Self-pay

## 2022-08-28 ENCOUNTER — Encounter: Payer: Self-pay | Admitting: Family Medicine

## 2022-08-28 LAB — RPR: RPR Ser Ql: NONREACTIVE

## 2022-08-28 LAB — CBC
Hematocrit: 26.6 % — ABNORMAL LOW (ref 34.0–46.6)
Hemoglobin: 8.2 g/dL — ABNORMAL LOW (ref 11.1–15.9)
MCH: 23.1 pg — ABNORMAL LOW (ref 26.6–33.0)
MCHC: 30.8 g/dL — ABNORMAL LOW (ref 31.5–35.7)
MCV: 75 fL — ABNORMAL LOW (ref 79–97)
Platelets: 268 10*3/uL (ref 150–450)
RBC: 3.55 x10E6/uL — ABNORMAL LOW (ref 3.77–5.28)
RDW: 14.9 % (ref 11.7–15.4)
WBC: 5.3 10*3/uL (ref 3.4–10.8)

## 2022-08-28 LAB — GLUCOSE TOLERANCE, 2 HOURS W/ 1HR
Glucose, 1 hour: 110 mg/dL (ref 70–179)
Glucose, 2 hour: 67 mg/dL — ABNORMAL LOW (ref 70–152)
Glucose, Fasting: 82 mg/dL (ref 70–91)

## 2022-08-28 LAB — HIV ANTIBODY (ROUTINE TESTING W REFLEX): HIV Screen 4th Generation wRfx: NONREACTIVE

## 2022-08-28 NOTE — Telephone Encounter (Signed)
Call placed to pt. Spoke with pt. Pt states seen results and message from Dr Dione Plover about anemia. Pt refusing to do IV iron at this time, pt states is going to try natural remedies to help first. Is currently taking PO iron every other day. Pt states will send mychart message by the end of week for decision and plan.  Will send to Dr Dione Plover for update.  Colletta Maryland, RNC

## 2022-08-28 NOTE — Telephone Encounter (Signed)
-----   Message from Clarnce Flock, MD sent at 08/28/2022 11:36 AM EDT ----- 28 wk labs show significant anemia, needs IV iron Orders have been placed, please coordinate scheduling

## 2022-08-29 ENCOUNTER — Other Ambulatory Visit: Payer: Self-pay | Admitting: Family Medicine

## 2022-08-29 DIAGNOSIS — O99013 Anemia complicating pregnancy, third trimester: Secondary | ICD-10-CM

## 2022-08-29 MED ORDER — SODIUM CHLORIDE 0.9 % IV BOLUS: 500.0000 mL | Freq: Once | INTRAVENOUS | Status: DC | PRN

## 2022-08-29 MED ORDER — DIPHENHYDRAMINE HCL 50 MG/ML IJ SOLN: 25.0000 mg | Freq: Once | INTRAMUSCULAR | Status: DC | PRN

## 2022-08-29 MED ORDER — SODIUM CHLORIDE 0.9 % IV SOLN: 500.0000 mg | INTRAVENOUS | Status: DC

## 2022-08-29 MED ORDER — ALBUTEROL SULFATE (2.5 MG/3ML) 0.083% IN NEBU: 2.5000 mg | INHALATION_SOLUTION | Freq: Once | RESPIRATORY_TRACT | Status: DC | PRN

## 2022-08-29 MED ORDER — METHYLPREDNISOLONE SODIUM SUCC 125 MG IJ SOLR: 125.0000 mg | Freq: Once | INTRAMUSCULAR | Status: DC | PRN

## 2022-08-29 MED ORDER — SODIUM CHLORIDE 0.9 % IV SOLN: INTRAVENOUS | Status: DC | PRN

## 2022-08-29 MED ORDER — EPINEPHRINE PF 1 MG/ML IJ SOLN: 0.3000 mg | Freq: Once | INTRAMUSCULAR | Status: DC | PRN

## 2022-08-29 NOTE — Progress Notes (Signed)
IV iron orders

## 2022-08-29 NOTE — Addendum Note (Signed)
Addended by: Georgia Lopes on: 08/29/2022 09:04 AM   Modules accepted: Orders

## 2022-09-03 ENCOUNTER — Encounter (HOSPITAL_COMMUNITY)
Admission: RE | Admit: 2022-09-03 | Discharge: 2022-09-03 | Disposition: A | Discharge status: Home / Self Care | Payer: Medicaid Other | Source: Ambulatory Visit | Attending: Family Medicine | Admitting: Family Medicine

## 2022-09-03 DIAGNOSIS — O99013 Anemia complicating pregnancy, third trimester: Secondary | ICD-10-CM | POA: Diagnosis present

## 2022-09-03 LAB — FERRITIN: Ferritin: 3 ng/mL — ABNORMAL LOW (ref 11–307)

## 2022-09-03 MED ORDER — SODIUM CHLORIDE 0.9 % IV SOLN
500.0000 mg | INTRAVENOUS | Status: DC
  Administered 2022-09-03: 500 mg via INTRAVENOUS
  Filled 2022-09-03: qty 500

## 2022-09-08 ENCOUNTER — Encounter: Payer: Medicaid Other | Admitting: Family Medicine

## 2022-09-09 ENCOUNTER — Other Ambulatory Visit: Payer: Self-pay

## 2022-09-09 ENCOUNTER — Ambulatory Visit (INDEPENDENT_AMBULATORY_CARE_PROVIDER_SITE_OTHER): Payer: Medicaid Other | Admitting: Family Medicine

## 2022-09-09 ENCOUNTER — Encounter: Payer: Self-pay | Admitting: Family Medicine

## 2022-09-09 VITALS — BP 128/71 | HR 93 | Wt 131.8 lb

## 2022-09-09 DIAGNOSIS — O99013 Anemia complicating pregnancy, third trimester: Secondary | ICD-10-CM

## 2022-09-09 DIAGNOSIS — Z3480 Encounter for supervision of other normal pregnancy, unspecified trimester: Secondary | ICD-10-CM

## 2022-09-09 DIAGNOSIS — D563 Thalassemia minor: Secondary | ICD-10-CM

## 2022-09-09 NOTE — Progress Notes (Signed)
Subjective:  Lori Silva is a 21 y.o. G1P0000 at 65w5dbeing seen today for ongoing prenatal care.  She is currently monitored for the following issues for this low-risk pregnancy and has Supervision of other normal pregnancy, antepartum; Anemia in pregnancy; and Alpha thalassemia silent carrier on their problem list.  Patient reports no complaints.  Contractions: Not present. Vag. Bleeding: None.  Movement: Present. Denies leaking of fluid.   The following portions of the patient's history were reviewed and updated as appropriate: allergies, current medications, past family history, past medical history, past social history, past surgical history and problem list. Problem list updated.  Objective:   Vitals:   09/09/22 1612  BP: 128/71  Pulse: 93  Weight: 131 lb 12.8 oz (59.8 kg)    Fetal Status: Fetal Heart Rate (bpm): 145 Fundal Height: 30 cm Movement: Present     General:  Alert, oriented and cooperative. Patient is in no acute distress.  Skin: Skin is warm and dry. No rash noted.   Cardiovascular: Normal heart rate noted  Respiratory: Normal respiratory effort, no problems with respiration noted  Abdomen: Soft, gravid, appropriate for gestational age. Pain/Pressure: Present     Pelvic: Vag. Bleeding: None     Cervical exam deferred        Extremities: Normal range of motion.     Mental Status: Normal mood and affect. Normal behavior. Normal judgment and thought content.   Urinalysis:      Assessment and Plan:  Pregnancy: G1P0000 at 19w5d1. Supervision of other normal pregnancy, antepartum BP and FHR normal Provided maternity belt  2. Alpha thalassemia silent carrier Will see partner in November and given testing kit at that time  3. Anemia during pregnancy in third trimester Lab Results  Component Value Date   HGB 8.2 (L) 08/27/2022   S/p IV iron x1, has another one tomorrow, recheck CBC in 4 weeks  Preterm labor symptoms and general obstetric precautions  including but not limited to vaginal bleeding, contractions, leaking of fluid and fetal movement were reviewed in detail with the patient. Please refer to After Visit Summary for other counseling recommendations.  Return for Dyad patient, ob visit.   EcClarnce FlockMD

## 2022-09-09 NOTE — Patient Instructions (Signed)

## 2022-09-10 ENCOUNTER — Encounter (HOSPITAL_COMMUNITY)
Admission: RE | Admit: 2022-09-10 | Discharge: 2022-09-10 | Disposition: A | Discharge status: Home / Self Care | Payer: Medicaid Other | Source: Ambulatory Visit | Attending: Family Medicine | Admitting: Family Medicine

## 2022-09-10 DIAGNOSIS — O99013 Anemia complicating pregnancy, third trimester: Secondary | ICD-10-CM | POA: Diagnosis not present

## 2022-09-10 MED ORDER — SODIUM CHLORIDE 0.9 % IV SOLN
500.0000 mg | INTRAVENOUS | Status: AC
  Administered 2022-09-10: 500 mg via INTRAVENOUS
  Filled 2022-09-10: qty 500

## 2022-09-18 ENCOUNTER — Ambulatory Visit: Payer: Medicaid Other | Admitting: *Deleted

## 2022-09-18 ENCOUNTER — Ambulatory Visit: Payer: Medicaid Other | Attending: Obstetrics and Gynecology

## 2022-09-18 VITALS — BP 115/67 | HR 92

## 2022-09-18 DIAGNOSIS — O99213 Obesity complicating pregnancy, third trimester: Secondary | ICD-10-CM | POA: Diagnosis not present

## 2022-09-18 DIAGNOSIS — D649 Anemia, unspecified: Secondary | ICD-10-CM

## 2022-09-18 DIAGNOSIS — Z148 Genetic carrier of other disease: Secondary | ICD-10-CM

## 2022-09-18 DIAGNOSIS — Z363 Encounter for antenatal screening for malformations: Secondary | ICD-10-CM | POA: Insufficient documentation

## 2022-09-18 DIAGNOSIS — O99013 Anemia complicating pregnancy, third trimester: Secondary | ICD-10-CM | POA: Insufficient documentation

## 2022-09-18 DIAGNOSIS — Z681 Body mass index (BMI) 19 or less, adult: Secondary | ICD-10-CM | POA: Insufficient documentation

## 2022-09-18 DIAGNOSIS — D563 Thalassemia minor: Secondary | ICD-10-CM | POA: Diagnosis present

## 2022-09-18 DIAGNOSIS — Z3A31 31 weeks gestation of pregnancy: Secondary | ICD-10-CM | POA: Insufficient documentation

## 2022-09-18 DIAGNOSIS — Z3480 Encounter for supervision of other normal pregnancy, unspecified trimester: Secondary | ICD-10-CM | POA: Diagnosis present

## 2022-09-23 ENCOUNTER — Ambulatory Visit (INDEPENDENT_AMBULATORY_CARE_PROVIDER_SITE_OTHER): Payer: Medicaid Other | Admitting: Family Medicine

## 2022-09-23 ENCOUNTER — Encounter: Payer: Self-pay | Admitting: Family Medicine

## 2022-09-23 ENCOUNTER — Other Ambulatory Visit: Payer: Self-pay

## 2022-09-23 VITALS — BP 118/75 | HR 93 | Wt 133.4 lb

## 2022-09-23 DIAGNOSIS — Z3480 Encounter for supervision of other normal pregnancy, unspecified trimester: Secondary | ICD-10-CM

## 2022-09-23 DIAGNOSIS — O99013 Anemia complicating pregnancy, third trimester: Secondary | ICD-10-CM

## 2022-09-23 DIAGNOSIS — D563 Thalassemia minor: Secondary | ICD-10-CM

## 2022-09-23 NOTE — Progress Notes (Signed)
   Subjective:  Lori Silva is a 21 y.o. G1P0000 at [redacted]w[redacted]d being seen today for ongoing prenatal care.  She is currently monitored for the following issues for this low-risk pregnancy and has Supervision of other normal pregnancy, antepartum; Anemia in pregnancy; and Alpha thalassemia silent carrier on their problem list.  Patient reports no complaints.  Contractions: Not present. Vag. Bleeding: None.  Movement: Present. Denies leaking of fluid.   The following portions of the patient's history were reviewed and updated as appropriate: allergies, current medications, past family history, past medical history, past social history, past surgical history and problem list. Problem list updated.  Objective:   Vitals:   09/23/22 1350  BP: 118/75  Pulse: 93  Weight: 133 lb 6.4 oz (60.5 kg)    Fetal Status: Fetal Heart Rate (bpm): 135   Movement: Present     General:  Alert, oriented and cooperative. Patient is in no acute distress.  Skin: Skin is warm and dry. No rash noted.   Cardiovascular: Normal heart rate noted  Respiratory: Normal respiratory effort, no problems with respiration noted  Abdomen: Soft, gravid, appropriate for gestational age. Pain/Pressure: Present     Pelvic: Vag. Bleeding: None     Cervical exam deferred        Extremities: Normal range of motion.     Mental Status: Normal mood and affect. Normal behavior. Normal judgment and thought content.   Urinalysis:      Assessment and Plan:  Pregnancy: G1P0000 at [redacted]w[redacted]d  1. Supervision of other normal pregnancy, antepartum BP and FHR normal Thinking about patch but unaware it could affect breastfeeding Referred to [email or web address], she would consider other options  2. Alpha thalassemia silent carrier   3. Anemia during pregnancy in third trimester S/p IV iron x2, ferritin was 3 Recheck CBC at next visit  Preterm labor symptoms and general obstetric precautions including but not limited to vaginal bleeding,  contractions, leaking of fluid and fetal movement were reviewed in detail with the patient. Please refer to After Visit Summary for other counseling recommendations.  Return in 2 weeks (on 10/07/2022) for Dyad patient, ob visit.   Clarnce Flock, MD

## 2022-09-23 NOTE — Patient Instructions (Signed)

## 2022-09-26 ENCOUNTER — Inpatient Hospital Stay (HOSPITAL_COMMUNITY)
Admission: AD | Admit: 2022-09-26 | Discharge: 2022-09-27 | Disposition: A | Discharge status: Home / Self Care | Payer: Medicaid Other | Attending: Obstetrics and Gynecology | Admitting: Obstetrics and Gynecology

## 2022-09-26 ENCOUNTER — Encounter (HOSPITAL_COMMUNITY): Payer: Self-pay | Admitting: Obstetrics and Gynecology

## 2022-09-26 DIAGNOSIS — O4700 False labor before 37 completed weeks of gestation, unspecified trimester: Secondary | ICD-10-CM | POA: Diagnosis not present

## 2022-09-26 DIAGNOSIS — D563 Thalassemia minor: Secondary | ICD-10-CM

## 2022-09-26 DIAGNOSIS — Z3A32 32 weeks gestation of pregnancy: Secondary | ICD-10-CM | POA: Insufficient documentation

## 2022-09-26 DIAGNOSIS — N858 Other specified noninflammatory disorders of uterus: Secondary | ICD-10-CM

## 2022-09-26 DIAGNOSIS — O4703 False labor before 37 completed weeks of gestation, third trimester: Secondary | ICD-10-CM | POA: Insufficient documentation

## 2022-09-26 DIAGNOSIS — Z3480 Encounter for supervision of other normal pregnancy, unspecified trimester: Secondary | ICD-10-CM

## 2022-09-26 LAB — URINALYSIS, ROUTINE W REFLEX MICROSCOPIC
Bilirubin Urine: NEGATIVE
Glucose, UA: NEGATIVE mg/dL
Hgb urine dipstick: NEGATIVE
Ketones, ur: NEGATIVE mg/dL
Leukocytes,Ua: NEGATIVE
Nitrite: NEGATIVE
Protein, ur: NEGATIVE mg/dL
Specific Gravity, Urine: 1.011 (ref 1.005–1.030)
pH: 6 (ref 5.0–8.0)

## 2022-09-26 LAB — FETAL FIBRONECTIN: Fetal Fibronectin: NEGATIVE

## 2022-09-26 MED ORDER — NIFEDIPINE 10 MG PO CAPS
10.0000 mg | ORAL_CAPSULE | ORAL | Status: DC | PRN
  Administered 2022-09-26 (×2): 10 mg via ORAL
  Filled 2022-09-26 (×3): qty 1

## 2022-09-26 NOTE — MAU Note (Signed)
.  Lori Silva is a 21 y.o. at [redacted]w[redacted]d here in MAU reporting abdominal pains for 53mins. Denies VB or LOF. Good FM.   Onset of complaint: 2030 Pain score: 7 Vitals:   09/26/22 2108 09/26/22 2111  BP:  124/74  Pulse: 98   Resp: 17   Temp: 98.1 F (36.7 C)   SpO2: 100%      FHT:134 Lab orders placed from triage:  u/a

## 2022-09-26 NOTE — MAU Provider Note (Signed)
Chief Complaint:  Abdominal Pain   Seen by provider at 2250   HPI: Lori Silva is a 21 y.o. G1P0000 at 43w1dwho presents to maternity admissions reporting lower abdominal pain for the past half hour.  . She reports good fetal movement, denies LOF, vaginal bleeding, vaginal itching/burning, urinary symptoms, h/a, dizziness, n/v, diarrhea, constipation or fever/chills.    Abdominal Pain This is a new problem. The current episode started today. The problem occurs intermittently. The problem is unchanged. The quality of the pain is described as cramping. Pertinent negatives include no diarrhea, fever, frequency or myalgias. Nothing relieves the symptoms. Past treatments include nothing.   RN note: Margarethe Virgen is a 21 y.o. at [redacted]w[redacted]d here in MAU reporting abdominal pains for 49mins. Denies VB or LOF. Good FM.   Onset of complaint: 2030 Pain score: 7  Past Medical History: Past Medical History:  Diagnosis Date   Anemia    Asthma     Past obstetric history: OB History  Gravida Para Term Preterm AB Living  1 0 0 0 0 0  SAB IAB Ectopic Multiple Live Births  0 0 0 0 0    # Outcome Date GA Lbr Len/2nd Weight Sex Delivery Anes PTL Lv  1 Current             Past Surgical History: History reviewed. No pertinent surgical history.  Family History: Family History  Problem Relation Age of Onset   Healthy Mother    Asthma Neg Hx    Birth defects Neg Hx    Cancer Neg Hx    Diabetes Neg Hx    Heart disease Neg Hx    Hypertension Neg Hx    Stroke Neg Hx     Social History: Social History   Tobacco Use   Smoking status: Never   Smokeless tobacco: Never  Vaping Use   Vaping Use: Never used  Substance Use Topics   Alcohol use: Never   Drug use: Not Currently    Allergies:  Allergies  Allergen Reactions   Fruit & Vegetable Daily [Nutritional Supplements] Itching    ALL fruits    Pumpkin Flavor     Meds:  Medications Prior to Admission  Medication Sig Dispense  Refill Last Dose   prenatal vitamin w/FE, FA (PRENATAL 1 + 1) 27-1 MG TABS tablet Take 1 tablet by mouth daily at 12 noon. 30 tablet 11 09/26/2022   docusate sodium (COLACE) 100 MG capsule Take 1 capsule (100 mg total) by mouth 2 (two) times daily as needed. (Patient not taking: Reported on 09/18/2022) 30 capsule 2    iron polysaccharides (NIFEREX) 150 MG capsule Take 1 capsule (150 mg total) by mouth every other day. 30 capsule 5     I have reviewed patient's Past Medical Hx, Surgical Hx, Family Hx, Social Hx, medications and allergies.   ROS:  Review of Systems  Constitutional:  Negative for fever.  Gastrointestinal:  Positive for abdominal pain. Negative for diarrhea.  Genitourinary:  Negative for frequency.  Musculoskeletal:  Negative for myalgias.   Other systems negative  Physical Exam  Patient Vitals for the past 24 hrs:  BP Temp Pulse Resp SpO2 Height Weight  09/26/22 2144 -- -- -- -- 99 % -- --  09/26/22 2127 115/75 -- (!) 114 -- -- -- --  09/26/22 2111 124/74 -- -- -- -- -- --  09/26/22 2108 -- 98.1 F (36.7 C) 98 17 100 % 5\' 2"  (1.575 m) 60.8 kg   Constitutional: Well-developed,  well-nourished female in no acute distress.  Cardiovascular: normal rate and rhythm Respiratory: normal effort, clear to auscultation bilaterally GI: Abd soft, non-tender, gravid appropriate for gestational age.   No rebound or guarding. MS: Extremities nontender, no edema, normal ROM Neurologic: Alert and oriented x 4.  GU: Neg CVAT.  PELVIC EXAM: Dilation: Closed Effacement (%): Thick Cervical Position: Posterior Station: Ballotable Presentation: Vertex Exam by:: Mayford Knife CNM  FHT:  Baseline 145 , moderate variability, accelerations present, no decelerations Contractions: q 2 mins    Labs: Results for orders placed or performed during the hospital encounter of 09/26/22 (from the past 24 hour(s))  Urinalysis, Routine w reflex microscopic Urine, Clean Catch     Status: None    Collection Time: 09/26/22  9:20 PM  Result Value Ref Range   Color, Urine YELLOW YELLOW   APPearance CLEAR CLEAR   Specific Gravity, Urine 1.011 1.005 - 1.030   pH 6.0 5.0 - 8.0   Glucose, UA NEGATIVE NEGATIVE mg/dL   Hgb urine dipstick NEGATIVE NEGATIVE   Bilirubin Urine NEGATIVE NEGATIVE   Ketones, ur NEGATIVE NEGATIVE mg/dL   Protein, ur NEGATIVE NEGATIVE mg/dL   Nitrite NEGATIVE NEGATIVE   Leukocytes,Ua NEGATIVE NEGATIVE  Fetal fibronectin     Status: None   Collection Time: 09/26/22 11:00 PM  Result Value Ref Range   Fetal Fibronectin NEGATIVE NEGATIVE    A/Positive/-- (06/16 5284)  Imaging:    MAU Course/MDM: I have reviewed the triage vital signs and the nursing notes.   Pertinent labs & imaging results that were available during my care of the patient were reviewed by me and considered in my medical decision making (see chart for details).      I have reviewed her medical records including past results, notes and treatments.   I have ordered labs and reviewed results. UA is clear.  Fetal Fibronectin is negative NST reviewed, reassuring  Treatments in MAU included EFM, Procardia series (got two doses and UCs diminished but did not stop).  BP was too low to give a third dose. Offered Terbutaline, declines Rechecked cervix, unchanged FFn negative.    Assessment: Single IUP at [redacted]w[redacted]d Preterm uterine contractions/uterine irritability with no cervical change and negative Fetal Fibronectin  Plan: Discharge home Preterm Labor precautions and fetal kick counts Follow up in Office for prenatal visits and recheck Encouraged to return if she develops worsening of symptoms, increase in pain, fever, or other concerning symptoms.   Pt stable at time of discharge.  Wynelle Bourgeois CNM, MSN Certified Nurse-Midwife 09/26/2022 11:05 PM

## 2022-09-27 DIAGNOSIS — O4700 False labor before 37 completed weeks of gestation, unspecified trimester: Secondary | ICD-10-CM | POA: Diagnosis present

## 2022-09-27 DIAGNOSIS — N858 Other specified noninflammatory disorders of uterus: Secondary | ICD-10-CM

## 2022-09-27 MED ORDER — TERBUTALINE SULFATE 1 MG/ML IJ SOLN
0.2500 mg | Freq: Once | INTRAMUSCULAR | Status: DC
  Filled 2022-09-27: qty 1

## 2022-10-07 ENCOUNTER — Other Ambulatory Visit: Payer: Self-pay

## 2022-10-07 ENCOUNTER — Encounter: Payer: Self-pay | Admitting: Family Medicine

## 2022-10-07 ENCOUNTER — Ambulatory Visit (INDEPENDENT_AMBULATORY_CARE_PROVIDER_SITE_OTHER): Payer: Medicaid Other | Admitting: Family Medicine

## 2022-10-07 VITALS — BP 124/84 | HR 96 | Wt 134.9 lb

## 2022-10-07 DIAGNOSIS — Z3480 Encounter for supervision of other normal pregnancy, unspecified trimester: Secondary | ICD-10-CM

## 2022-10-07 DIAGNOSIS — O99013 Anemia complicating pregnancy, third trimester: Secondary | ICD-10-CM

## 2022-10-07 LAB — CBC
Hematocrit: 31.9 % — ABNORMAL LOW (ref 34.0–46.6)
Hemoglobin: 10 g/dL — ABNORMAL LOW (ref 11.1–15.9)
MCH: 24.3 pg — ABNORMAL LOW (ref 26.6–33.0)
MCHC: 31.3 g/dL — ABNORMAL LOW (ref 31.5–35.7)
MCV: 78 fL — ABNORMAL LOW (ref 79–97)
Platelets: 158 10*3/uL (ref 150–450)
RBC: 4.11 x10E6/uL (ref 3.77–5.28)
RDW: 19.8 % — ABNORMAL HIGH (ref 11.7–15.4)
WBC: 7.2 10*3/uL (ref 3.4–10.8)

## 2022-10-07 NOTE — Progress Notes (Signed)
   Subjective:  Lori Silva is a 21 y.o. G1P0000 at [redacted]w[redacted]d being seen today for ongoing prenatal care.  She is currently monitored for the following issues for this low-risk pregnancy and has Supervision of other normal pregnancy, antepartum; Anemia in pregnancy; Alpha thalassemia silent carrier; and Preterm uterine contractions on their problem list.  Patient reports no complaints.  Contractions: Not present. Vag. Bleeding: None.  Movement: Present. Denies leaking of fluid.   The following portions of the patient's history were reviewed and updated as appropriate: allergies, current medications, past family history, past medical history, past social history, past surgical history and problem list. Problem list updated.  Objective:   Vitals:   10/07/22 1609  BP: 124/84  Pulse: 96  Weight: 134 lb 14.4 oz (61.2 kg)    Fetal Status: Fetal Heart Rate (bpm): 137   Movement: Present     General:  Alert, oriented and cooperative. Patient is in no acute distress.  Skin: Skin is warm and dry. No rash noted.   Cardiovascular: Normal heart rate noted  Respiratory: Normal respiratory effort, no problems with respiration noted  Abdomen: Soft, gravid, appropriate for gestational age. Pain/Pressure: Absent     Pelvic: Vag. Bleeding: None     Cervical exam deferred        Extremities: Normal range of motion.     Mental Status: Normal mood and affect. Normal behavior. Normal judgment and thought content.   Urinalysis:      Assessment and Plan:  Pregnancy: G1P0000 at [redacted]w[redacted]d  1. Supervision of other normal pregnancy, antepartum BP and FHR normal  2. Anemia during pregnancy in third trimester Recheck today, s/p IV iron x2 - CBC  Preterm labor symptoms and general obstetric precautions including but not limited to vaginal bleeding, contractions, leaking of fluid and fetal movement were reviewed in detail with the patient. Please refer to After Visit Summary for other counseling  recommendations.  Return in 2 weeks (on 10/21/2022) for Dyad patient, ob visit.   Clarnce Flock, MD

## 2022-10-07 NOTE — Patient Instructions (Signed)

## 2022-10-20 ENCOUNTER — Encounter: Payer: Self-pay | Admitting: Family Medicine

## 2022-10-20 ENCOUNTER — Ambulatory Visit (INDEPENDENT_AMBULATORY_CARE_PROVIDER_SITE_OTHER): Payer: Medicaid Other | Admitting: Family Medicine

## 2022-10-20 VITALS — BP 131/77 | HR 103 | Wt 139.5 lb

## 2022-10-20 DIAGNOSIS — O4700 False labor before 37 completed weeks of gestation, unspecified trimester: Secondary | ICD-10-CM

## 2022-10-20 DIAGNOSIS — O99013 Anemia complicating pregnancy, third trimester: Secondary | ICD-10-CM

## 2022-10-20 DIAGNOSIS — Z3480 Encounter for supervision of other normal pregnancy, unspecified trimester: Secondary | ICD-10-CM

## 2022-10-20 DIAGNOSIS — D563 Thalassemia minor: Secondary | ICD-10-CM

## 2022-10-20 NOTE — Progress Notes (Signed)
   PRENATAL VISIT NOTE  Subjective:  Lori Silva is a 21 y.o. G1P0000 at [redacted]w[redacted]d being seen today for ongoing prenatal care.  She is currently monitored for the following issues for this low-risk pregnancy and has Supervision of other normal pregnancy, antepartum; Anemia in pregnancy; Alpha thalassemia silent carrier; and Preterm uterine contractions on their problem list.  Patient reports no complaints.  Contractions: Not present. Vag. Bleeding: None.  Movement: Present. Denies leaking of fluid.   The following portions of the patient's history were reviewed and updated as appropriate: allergies, current medications, past family history, past medical history, past social history, past surgical history and problem list.   Objective:   Vitals:   10/20/22 1123  BP: 131/77  Pulse: (!) 103  Weight: 139 lb 8 oz (63.3 kg)    Fetal Status: Fetal Heart Rate (bpm): 128 Fundal Height: 36 cm Movement: Present     General:  Alert, oriented and cooperative. Patient is in no acute distress.  Skin: Skin is warm and dry. No rash noted.   Cardiovascular: Normal heart rate noted  Respiratory: Normal respiratory effort, no problems with respiration noted  Abdomen: Soft, gravid, appropriate for gestational age.  Pain/Pressure: Present     Pelvic: Cervical exam deferred        Extremities: Normal range of motion.     Mental Status: Normal mood and affect. Normal behavior. Normal judgment and thought content.   Assessment and Plan:  Pregnancy: G1P0000 at [redacted]w[redacted]d 1. Alpha thalassemia silent carrier  2. Anemia during pregnancy in third trimester S/p Fe infusions HGB= 10.0 in Early Nov!  3. Preterm uterine contractions  4. Supervision of other normal pregnancy, antepartum Up to date No concerns today No questions. Staying local for holiday Aware of swabs next week.   Preterm labor symptoms and general obstetric precautions including but not limited to vaginal bleeding, contractions, leaking of  fluid and fetal movement were reviewed in detail with the patient. Please refer to After Visit Summary for other counseling recommendations.   No follow-ups on file.  Future Appointments  Date Time Provider Department Center  10/28/2022 10:55 AM Venora Maples, MD [facility]  11/05/2022  2:55 PM Venora Maples, MD [facility]  11/20/2022  8:15 AM Federico Flake, MD [facility]  11/20/2022  9:15 AM [facility]    Federico Flake, MD

## 2022-10-28 ENCOUNTER — Encounter: Payer: Self-pay | Admitting: Family Medicine

## 2022-10-28 ENCOUNTER — Other Ambulatory Visit: Payer: Self-pay | Admitting: Family Medicine

## 2022-10-28 ENCOUNTER — Ambulatory Visit (INDEPENDENT_AMBULATORY_CARE_PROVIDER_SITE_OTHER): Payer: Medicaid Other | Admitting: Family Medicine

## 2022-10-28 ENCOUNTER — Other Ambulatory Visit (HOSPITAL_COMMUNITY)
Admission: RE | Admit: 2022-10-28 | Discharge: 2022-10-28 | Disposition: A | Discharge status: Home / Self Care | Payer: Medicaid Other | Source: Ambulatory Visit | Attending: Family Medicine | Admitting: Family Medicine

## 2022-10-28 ENCOUNTER — Other Ambulatory Visit: Payer: Self-pay

## 2022-10-28 VITALS — BP 143/99 | HR 82 | Wt 144.8 lb

## 2022-10-28 DIAGNOSIS — Z3480 Encounter for supervision of other normal pregnancy, unspecified trimester: Secondary | ICD-10-CM | POA: Insufficient documentation

## 2022-10-28 DIAGNOSIS — O99013 Anemia complicating pregnancy, third trimester: Secondary | ICD-10-CM

## 2022-10-28 DIAGNOSIS — O163 Unspecified maternal hypertension, third trimester: Secondary | ICD-10-CM | POA: Insufficient documentation

## 2022-10-28 DIAGNOSIS — O4700 False labor before 37 completed weeks of gestation, unspecified trimester: Secondary | ICD-10-CM

## 2022-10-28 NOTE — Patient Instructions (Signed)

## 2022-10-28 NOTE — Progress Notes (Signed)
   Subjective:  Lori Silva is a 21 y.o. G1P0000 at [redacted]w[redacted]d being seen today for ongoing prenatal care.  She is currently monitored for the following issues for this low-risk pregnancy and has Supervision of other normal pregnancy, antepartum; Anemia in pregnancy; Alpha thalassemia silent carrier; and Preterm uterine contractions on their problem list.  Patient reports no complaints.  Contractions: Irritability. Vag. Bleeding: None.  Movement: Present. Denies leaking of fluid.   The following portions of the patient's history were reviewed and updated as appropriate: allergies, current medications, past family history, past medical history, past social history, past surgical history and problem list. Problem list updated.  Objective:   Vitals:   10/28/22 1112 10/28/22 1124  BP: (!) 131/92 (!) 143/99  Pulse: 82   Weight: 144 lb 12.5 oz (65.7 kg)     Fetal Status: Fetal Heart Rate (bpm): 132   Movement: Present     General:  Alert, oriented and cooperative. Patient is in no acute distress.  Skin: Skin is warm and dry. No rash noted.   Cardiovascular: Normal heart rate noted  Respiratory: Normal respiratory effort, no problems with respiration noted  Abdomen: Soft, gravid, appropriate for gestational age. Pain/Pressure: Present     Pelvic: Vag. Bleeding: None     Cervical exam performed        Extremities: Normal range of motion.     Mental Status: Normal mood and affect. Normal behavior. Normal judgment and thought content.   Urinalysis:      Assessment and Plan:  Pregnancy: G1P0000 at [redacted]w[redacted]d  1. Supervision of other normal pregnancy, antepartum BP elevated, see below FHR normal Vertex confirmed on bedside US Swabs collected - GC/Chlamydia probe amp ([place])not at [facility] - Culture, beta strep (group b only)  2. Preterm uterine contractions Occasional, declined cervical check today  3. Anemia during pregnancy in third trimester Lab Results  Component Value Date   HGB  10.0 (L) 10/07/2022   Improved s/p IV iron  4. Elevated blood pressure reading without diagnosis of hypertension Asymptomatic BP mild range on initial check, repeat still elevated Check labs today Have back later this week for BP check with nurse   Preterm labor symptoms and general obstetric precautions including but not limited to vaginal bleeding, contractions, leaking of fluid and fetal movement were reviewed in detail with the patient. Please refer to After Visit Summary for other counseling recommendations.  Return in 1 week (on 11/04/2022) for Dyad patient, ob visit.   Venora Maples, MD

## 2022-10-29 ENCOUNTER — Encounter: Payer: Self-pay | Admitting: Family Medicine

## 2022-10-29 LAB — GC/CHLAMYDIA PROBE AMP (~~LOC~~) NOT AT ARMC
Chlamydia: NEGATIVE
Comment: NEGATIVE
Comment: NORMAL
Neisseria Gonorrhea: NEGATIVE

## 2022-10-30 ENCOUNTER — Other Ambulatory Visit: Payer: Self-pay

## 2022-10-30 ENCOUNTER — Encounter (HOSPITAL_COMMUNITY): Payer: Self-pay | Admitting: Obstetrics & Gynecology

## 2022-10-30 ENCOUNTER — Ambulatory Visit (INDEPENDENT_AMBULATORY_CARE_PROVIDER_SITE_OTHER): Payer: Medicaid Other | Admitting: Family Medicine

## 2022-10-30 ENCOUNTER — Inpatient Hospital Stay (HOSPITAL_COMMUNITY)
Admission: AD | Admit: 2022-10-30 | Discharge: 2022-11-03 | DRG: 806 | Disposition: A | Discharge status: Home / Self Care | Payer: Medicaid Other | Attending: Obstetrics and Gynecology | Admitting: Obstetrics and Gynecology

## 2022-10-30 VITALS — BP 161/113 | HR 84 | Wt 144.0 lb

## 2022-10-30 DIAGNOSIS — Z148 Genetic carrier of other disease: Secondary | ICD-10-CM

## 2022-10-30 DIAGNOSIS — Z3A37 37 weeks gestation of pregnancy: Secondary | ICD-10-CM

## 2022-10-30 DIAGNOSIS — D62 Acute posthemorrhagic anemia: Secondary | ICD-10-CM | POA: Diagnosis not present

## 2022-10-30 DIAGNOSIS — O9081 Anemia of the puerperium: Secondary | ICD-10-CM | POA: Diagnosis not present

## 2022-10-30 DIAGNOSIS — Z3480 Encounter for supervision of other normal pregnancy, unspecified trimester: Secondary | ICD-10-CM

## 2022-10-30 DIAGNOSIS — O9049 Other postpartum acute kidney failure: Secondary | ICD-10-CM | POA: Diagnosis present

## 2022-10-30 DIAGNOSIS — O99019 Anemia complicating pregnancy, unspecified trimester: Secondary | ICD-10-CM | POA: Diagnosis present

## 2022-10-30 DIAGNOSIS — O4700 False labor before 37 completed weeks of gestation, unspecified trimester: Secondary | ICD-10-CM

## 2022-10-30 DIAGNOSIS — D563 Thalassemia minor: Secondary | ICD-10-CM

## 2022-10-30 DIAGNOSIS — O1414 Severe pre-eclampsia complicating childbirth: Principal | ICD-10-CM | POA: Diagnosis present

## 2022-10-30 DIAGNOSIS — N179 Acute kidney failure, unspecified: Secondary | ICD-10-CM | POA: Diagnosis not present

## 2022-10-30 DIAGNOSIS — O471 False labor at or after 37 completed weeks of gestation: Secondary | ICD-10-CM

## 2022-10-30 DIAGNOSIS — R030 Elevated blood-pressure reading, without diagnosis of hypertension: Secondary | ICD-10-CM | POA: Diagnosis present

## 2022-10-30 DIAGNOSIS — O1410 Severe pre-eclampsia, unspecified trimester: Principal | ICD-10-CM | POA: Diagnosis present

## 2022-10-30 LAB — COMPREHENSIVE METABOLIC PANEL
ALT: 8 IU/L (ref 0–32)
ALT: 11 U/L (ref 0–44)
AST: 13 IU/L (ref 0–40)
AST: 16 U/L (ref 15–41)
Albumin/Globulin Ratio: 1.4 (ref 1.2–2.2)
Albumin: 3.3 g/dL — ABNORMAL LOW (ref 4.0–5.0)
Albumin: 3 g/dL — ABNORMAL LOW (ref 3.5–5.0)
Alkaline Phosphatase: 183 IU/L — ABNORMAL HIGH (ref 42–106)
Alkaline Phosphatase: 172 U/L — ABNORMAL HIGH (ref 38–126)
Anion gap: 11 (ref 5–15)
BUN/Creatinine Ratio: 7 — ABNORMAL LOW (ref 9–23)
BUN: 5 mg/dL — ABNORMAL LOW (ref 6–20)
BUN: 8 mg/dL (ref 6–20)
Bilirubin Total: 0.3 mg/dL (ref 0.0–1.2)
CO2: 19 mmol/L — ABNORMAL LOW (ref 20–29)
CO2: 22 mmol/L (ref 22–32)
Calcium: 8.6 mg/dL — ABNORMAL LOW (ref 8.7–10.2)
Calcium: 9.2 mg/dL (ref 8.9–10.3)
Chloride: 104 mmol/L (ref 96–106)
Chloride: 106 mmol/L (ref 98–111)
Creatinine, Ser: 0.7 mg/dL (ref 0.57–1.00)
Creatinine, Ser: 0.78 mg/dL (ref 0.44–1.00)
GFR, Estimated: >60 mL/min (ref >60)
Globulin, Total: 2.4 g/dL (ref 1.5–4.5)
Glucose, Bld: 66 mg/dL — ABNORMAL LOW (ref 70–99)
Glucose: 73 mg/dL (ref 70–99)
Potassium: 3.8 mmol/L (ref 3.5–5.2)
Potassium: 3.8 mmol/L (ref 3.5–5.1)
Sodium: 138 mmol/L (ref 134–144)
Sodium: 139 mmol/L (ref 135–145)
Total Bilirubin: 0.5 mg/dL (ref 0.3–1.2)
Total Protein: 5.7 g/dL — ABNORMAL LOW (ref 6.0–8.5)
Total Protein: 6.1 g/dL — ABNORMAL LOW (ref 6.5–8.1)
eGFR: 128 mL/min/{1.73_m2} (ref >59)

## 2022-10-30 LAB — CBC
HCT: 35.9 % — ABNORMAL LOW (ref 36.0–46.0)
Hematocrit: 32.7 % — ABNORMAL LOW (ref 34.0–46.6)
Hemoglobin: 10.4 g/dL — ABNORMAL LOW (ref 11.1–15.9)
Hemoglobin: 11 g/dL — ABNORMAL LOW (ref 12.0–15.0)
MCH: 24.6 pg — ABNORMAL LOW (ref 26.6–33.0)
MCH: 24.7 pg — ABNORMAL LOW (ref 26.0–34.0)
MCHC: 31.8 g/dL (ref 31.5–35.7)
MCHC: 30.6 g/dL (ref 30.0–36.0)
MCV: 78 fL — ABNORMAL LOW (ref 79–97)
MCV: 80.7 fL (ref 80.0–100.0)
Platelets: 178 10*3/uL (ref 150–450)
Platelets: 176 10*3/uL (ref 150–400)
RBC: 4.22 x10E6/uL (ref 3.77–5.28)
RBC: 4.45 MIL/uL (ref 3.87–5.11)
RDW: 18.2 % — ABNORMAL HIGH (ref 11.7–15.4)
RDW: 19.8 % — ABNORMAL HIGH (ref 11.5–15.5)
WBC: 6.4 10*3/uL (ref 3.4–10.8)
WBC: 7.7 10*3/uL (ref 4.0–10.5)
nRBC: 0 % (ref 0.0–0.2)

## 2022-10-30 LAB — TYPE AND SCREEN
ABO/RH(D): A POS
Antibody Screen: NEGATIVE

## 2022-10-30 LAB — GROUP B STREP BY PCR: Group B strep by PCR: NEGATIVE

## 2022-10-30 LAB — PROTEIN / CREATININE RATIO, URINE
Creatinine, Urine: 94.1 mg/dL
Creatinine, Urine: 82 mg/dL
Protein Creatinine Ratio: 0.16 mg/mg{Cre} — ABNORMAL HIGH (ref 0.00–0.15)
Protein, Ur: 9.7 mg/dL
Protein/Creat Ratio: 103 mg/g creat (ref 0–200)
Total Protein, Urine: 13 mg/dL

## 2022-10-30 LAB — HIV ANTIBODY (ROUTINE TESTING W REFLEX): HIV Screen 4th Generation wRfx: NONREACTIVE

## 2022-10-30 MED ORDER — OXYTOCIN BOLUS FROM INFUSION
333.0000 mL | Freq: Once | INTRAVENOUS | Status: AC
  Administered 2022-11-01: 333 mL via INTRAVENOUS

## 2022-10-30 MED ORDER — EPHEDRINE 5 MG/ML INJ: 10.0000 mg | INTRAVENOUS | Status: DC | PRN

## 2022-10-30 MED ORDER — FENTANYL-BUPIVACAINE-NACL 0.5-0.125-0.9 MG/250ML-% EP SOLN
12.0000 mL/h | EPIDURAL | Status: DC | PRN
  Administered 2022-11-01: 12 mL/h via EPIDURAL
  Filled 2022-10-30: qty 250

## 2022-10-30 MED ORDER — LABETALOL HCL 5 MG/ML IV SOLN: 80.0000 mg | INTRAVENOUS | Status: DC | PRN

## 2022-10-30 MED ORDER — OXYTOCIN-SODIUM CHLORIDE 30-0.9 UT/500ML-% IV SOLN
1.0000 m[IU]/min | INTRAVENOUS | Status: DC
  Administered 2022-10-31: 2 m[IU]/min via INTRAVENOUS

## 2022-10-30 MED ORDER — HYDRALAZINE HCL 20 MG/ML IJ SOLN: 10.0000 mg | INTRAMUSCULAR | Status: DC | PRN

## 2022-10-30 MED ORDER — ONDANSETRON HCL 4 MG/2ML IJ SOLN
4.0000 mg | Freq: Four times a day (QID) | INTRAMUSCULAR | Status: DC | PRN
  Administered 2022-11-01: 4 mg via INTRAVENOUS

## 2022-10-30 MED ORDER — LACTATED RINGERS IV SOLN
500.0000 mL | INTRAVENOUS | Status: DC | PRN
  Administered 2022-11-01 (×2): 500 mL via INTRAVENOUS

## 2022-10-30 MED ORDER — MISOPROSTOL 50MCG HALF TABLET
50.0000 ug | ORAL_TABLET | ORAL | Status: DC
  Administered 2022-10-30: 50 ug via BUCCAL
  Filled 2022-10-30: qty 1

## 2022-10-30 MED ORDER — MAGNESIUM SULFATE BOLUS VIA INFUSION
4.0000 g | Freq: Once | INTRAVENOUS | Status: AC
  Administered 2022-10-30: 4 g via INTRAVENOUS
  Filled 2022-10-30: qty 1000

## 2022-10-30 MED ORDER — MAGNESIUM SULFATE 40 GM/1000ML IV SOLN
1.0000 g/h | INTRAVENOUS | Status: DC
  Administered 2022-10-31: 1 g/h via INTRAVENOUS
  Administered 2022-10-31: 2 g/h via INTRAVENOUS
  Filled 2022-10-30 (×3): qty 1000

## 2022-10-30 MED ORDER — ACETAMINOPHEN 325 MG PO TABS
650.0000 mg | ORAL_TABLET | ORAL | Status: DC | PRN
  Administered 2022-10-30 – 2022-11-01 (×3): 650 mg via ORAL
  Filled 2022-10-30 (×3): qty 2

## 2022-10-30 MED ORDER — MISOPROSTOL 50MCG HALF TABLET
50.0000 ug | ORAL_TABLET | Freq: Once | ORAL | Status: AC
  Administered 2022-10-30: 50 ug via ORAL
  Filled 2022-10-30: qty 1

## 2022-10-30 MED ORDER — PHENYLEPHRINE 80 MCG/ML (10ML) SYRINGE FOR IV PUSH (FOR BLOOD PRESSURE SUPPORT): 80.0000 ug | PREFILLED_SYRINGE | INTRAVENOUS | Status: DC | PRN

## 2022-10-30 MED ORDER — MISOPROSTOL 25 MCG QUARTER TABLET
25.0000 ug | ORAL_TABLET | Freq: Once | ORAL | Status: AC
  Administered 2022-10-30: 25 ug via VAGINAL
  Filled 2022-10-30: qty 1

## 2022-10-30 MED ORDER — LABETALOL HCL 5 MG/ML IV SOLN
20.0000 mg | INTRAVENOUS | Status: DC | PRN
  Administered 2022-10-30 – 2022-11-01 (×2): 20 mg via INTRAVENOUS
  Filled 2022-10-30 (×2): qty 4

## 2022-10-30 MED ORDER — DIPHENHYDRAMINE HCL 50 MG/ML IJ SOLN: 12.5000 mg | INTRAMUSCULAR | Status: DC | PRN

## 2022-10-30 MED ORDER — LACTATED RINGERS IV SOLN: INTRAVENOUS | Status: DC

## 2022-10-30 MED ORDER — LABETALOL HCL 5 MG/ML IV SOLN: 40.0000 mg | INTRAVENOUS | Status: DC | PRN

## 2022-10-30 MED ORDER — OXYCODONE-ACETAMINOPHEN 5-325 MG PO TABS: 2.0000 | ORAL_TABLET | ORAL | Status: DC | PRN

## 2022-10-30 MED ORDER — LACTATED RINGERS IV SOLN
500.0000 mL | Freq: Once | INTRAVENOUS | Status: AC
  Administered 2022-10-31: 500 mL via INTRAVENOUS

## 2022-10-30 MED ORDER — MISOPROSTOL 50MCG HALF TABLET: 50.0000 ug | ORAL_TABLET | ORAL | Status: DC

## 2022-10-30 MED ORDER — SOD CITRATE-CITRIC ACID 500-334 MG/5ML PO SOLN
30.0000 mL | ORAL | Status: DC | PRN
  Filled 2022-10-30: qty 30

## 2022-10-30 MED ORDER — OXYCODONE-ACETAMINOPHEN 5-325 MG PO TABS: 1.0000 | ORAL_TABLET | ORAL | Status: DC | PRN

## 2022-10-30 MED ORDER — FAMOTIDINE IN NACL 20-0.9 MG/50ML-% IV SOLN
20.0000 mg | Freq: Once | INTRAVENOUS | Status: AC
  Administered 2022-10-30: 20 mg via INTRAVENOUS
  Filled 2022-10-30: qty 50

## 2022-10-30 MED ORDER — OXYTOCIN-SODIUM CHLORIDE 30-0.9 UT/500ML-% IV SOLN
2.5000 [IU]/h | INTRAVENOUS | Status: DC
  Filled 2022-10-30 (×2): qty 500

## 2022-10-30 MED ORDER — TERBUTALINE SULFATE 1 MG/ML IJ SOLN
0.2500 mg | Freq: Once | INTRAMUSCULAR | Status: AC | PRN
  Administered 2022-10-31: 0.25 mg via SUBCUTANEOUS
  Filled 2022-10-30: qty 1

## 2022-10-30 MED ORDER — LIDOCAINE HCL (PF) 1 % IJ SOLN: 30.0000 mL | INTRAMUSCULAR | Status: DC | PRN

## 2022-10-30 NOTE — Progress Notes (Addendum)
Labor Progress Note Tamela Elsayed is a 21 y.o. G1P0000 at [redacted]w[redacted]d presented for IOL due to pre-eclampsia with severe features.  S: called to bedside by RN. Patient complaining of chest pain, blurry vision, RUQ pain. She was started on Magnesium about 1 pm today for seizure prophylaxis.  I check on patient. She is alert and active, appears comfortable, complains about a burning sensation in her chest, no chest heaviness or tightness. Burning sensation goes up into her throat. She states that the RUQ pain has resolved at this time.  O:  BP (!) 145/104   Pulse 83   Temp 97.6 F (36.4 C) (Axillary)   Resp 17   Ht 5\' 2"  (1.575 m)   Wt 65.3 kg   LMP 02/13/2022   SpO2 97%   BMI 26.34 kg/m  EFM: 110bpm /moderate/+accels, no decels.  DTR: 1+ reflexes patellar and achilles reflexes.  Intermittent variable decels noted earlier, but have resolved at the time of my evaluation. Overall her strip looks reassuring  Contractions: q2 - 4 mins on toco, with some uterine irritability  CVE: Dilation: 1 Effacement (%): Thick Station: -2 Presentation: Vertex Exam by:: Dr. [email or web address]   A&P: 21 y.o. G1P0000 [redacted]w[redacted]d with IOL for pre-eclampsia with severe features. #Labor: Progressing well. S/p cytotec X2 rounds. FB placed successfully.  #Pain: IV fentanyl prn, epidural when patient in active labor #FWB: Category 1 #GBS negative  Pre-eclampsia with SF:  Currently on Mg. Mg dc'd temporarily, given symptoms, pending lab results. Labs - Mg level, CMP ordered.  IV pepcid for chest pain, given dyspeptic nature of symptoms.  Continue to monitor   Refugio Vandevoorde [redacted]w[redacted]d, MD 11:47 PM

## 2022-10-30 NOTE — Progress Notes (Signed)
   PRENATAL VISIT NOTE  Subjective:  Lori Silva is a 21 y.o. G1P0000 at [redacted]w[redacted]d being seen today for ongoing prenatal care.  She is currently monitored for the following issues for this low-risk pregnancy and has Supervision of other normal pregnancy, antepartum; Anemia in pregnancy; Alpha thalassemia silent carrier; Preterm uterine contractions; and Elevated blood pressure affecting pregnancy in third trimester, antepartum on their problem list.  Patient reports no complaints.  Contractions: Irregular. Vag. Bleeding: None.  Movement: Present. Denies leaking of fluid.   The following portions of the patient's history were reviewed and updated as appropriate: allergies, current medications, past family history, past medical history, past social history, past surgical history and problem list.   Objective:   Vitals:   10/30/22 1041 10/30/22 1055  BP: (!) 172/112 (!) 161/113  Pulse: 70 84  Weight: 144 lb (65.3 kg)     Fetal Status:     Movement: Present     General:  Alert, oriented and cooperative. Patient is in no acute distress.  Skin: Skin is warm and dry. No rash noted.   Cardiovascular: Normal heart rate noted  Respiratory: Normal respiratory effort, no problems with respiration noted  Abdomen: Soft, gravid, appropriate for gestational age.  Pain/Pressure: Present     Pelvic: Cervical exam deferred        Extremities: Normal range of motion.  Edema: Trace  Mental Status: Normal mood and affect. Normal behavior. Normal judgment and thought content.   Assessment and Plan:  Pregnancy: G1P0000 at [redacted]w[redacted]d 1. Supervision of other normal pregnancy, antepartum BP is elevated in severe range today Prior elevation 2 days ago. Normal labs.  GBS is pending For admission to Labor and delivery, message sent and charge nurse called.  2. Alpha thalassemia silent carrier   3. Preterm uterine contractions   Term labor symptoms and general obstetric precautions including but not limited  to vaginal bleeding, contractions, leaking of fluid and fetal movement were reviewed in detail with the patient. Please refer to After Visit Summary for other counseling recommendations.   No follow-ups on file.  Future Appointments  Date Time Provider Department Center  11/05/2022  2:55 PM Venora Maples, MD [facility]  11/20/2022  8:15 AM Federico Flake, MD [facility]  11/20/2022  9:15 AM [facility]    Reva Bores, MD

## 2022-10-30 NOTE — Progress Notes (Signed)
Here today for BP check at 37w of pregnancy. Pt reports BP at home yesterday was 157/93. Reports feeling heart palpitations daily. BP 172/112 today; recheck 161/113. Shawnie Pons MD to bedside to discuss direct admission for IOL.   Fleet Contras RN

## 2022-10-30 NOTE — H&P (Signed)
OBSTETRIC ADMISSION HISTORY AND PHYSICAL  Lori Silva is a 21 y.o. female G1P0000 with IUP at 68109w0d by LMP presenting for IOL due to severely elevated blood pressure. [facility] blood pressure up to 172/112 today, increased from 143/99 2 days ago.   She reports +FMs, No LOF, no VB, no blurry vision, headaches or peripheral edema, and RUQ pain.    She plans on breast feeding. She request the patch for birth control. [facility]   Dating: By [facility] --->  Estimated Date of Delivery: 11/20/22  Sono:    @[redacted]w[redacted]d , CWD, normal anatomy, cephalic presentation, 53% EFW   Prenatal History/Complications:  Severely elevated blood pressure  Alpha thalassemia silent carrier Anemia in pregnancy  Preterm uterine contractions  Past Medical History: Past Medical History:  Diagnosis Date   Anemia    Asthma     Past Surgical History: History reviewed. No pertinent surgical history.  Obstetrical History: OB History     Gravida  1   Para  0   Term  0   Preterm  0   AB  0   Living  0      SAB  0   IAB  0   Ectopic  0   Multiple  0   Live Births  0           Social History Social History   Socioeconomic History   Marital status: Single    Spouse name: Not on file   Number of children: Not on file   Years of education: Not on file   Highest education level: Not on file  Occupational History   Not on file  Tobacco Use   Smoking status: Never   Smokeless tobacco: Never  Vaping Use   Vaping Use: Never used  Substance and Sexual Activity   Alcohol use: Never   Drug use: Not Currently   Sexual activity: Yes  Other Topics Concern   Not on file  Social History Narrative   Not on file   Social Determinants of Health   Financial Resource Strain: Not on file  Food Insecurity: No Food Insecurity (10/30/2022)   Hunger Vital Sign    Worried About Running Out of Food in the Last Year: Never true    Ran Out of Food  in the Last Year: Never true  Transportation Needs: No Transportation Needs (10/30/2022)   PRAPARE - Administrator, Civil ServiceTransportation    Lack of Transportation (Medical): No    Lack of Transportation (Non-Medical): No  Physical Activity: Not on file  Stress: Not on file  Social Connections: Not on file    Family History: Family History  Problem Relation Age of Onset   Healthy Mother    Asthma Neg Hx    Birth defects Neg Hx    Cancer Neg Hx    Diabetes Neg Hx    Heart disease Neg Hx    Hypertension Neg Hx    Stroke Neg Hx     Allergies: Allergies  Allergen Reactions   Fruit & Vegetable Daily [Nutritional Supplements] Itching    ALL fruits    Pumpkin Flavor     Medications Prior to Admission  Medication Sig Dispense Refill Last Dose   prenatal vitamin w/FE, FA (PRENATAL 1 + 1) 27-1 MG TABS tablet Take 1 tablet by mouth daily at 12 noon. 30 tablet 11     Review of Systems   All systems reviewed and negative  except as stated in HPI  Blood pressure (!) 140/88, pulse 87, resp. rate 14, height 5\' 2"  (1.575 m), weight 65.3 kg, last menstrual period 02/13/2022, SpO2 97 %. General appearance: alert, cooperative, and no distress Abdomen: soft, non-tender; bowel sounds normal Extremities: no sign of DVT DTR's normal  Presentation: cephalic Fetal monitoringDecelerations: Absent Uterine activity: rare  Dilation: Closed Station: -2 Exam by:: lee   Prenatal labs: ABO, Rh: --/--/A POS (11/30 1300) Antibody: NEG (11/30 1300) Rubella: 3.54 (06/16 0928) RPR: Non Reactive (09/27 0928)  HBsAg: Negative (06/16 0928)  HIV: Non Reactive (11/30 1456)  GBS: NEGATIVE/-- (11/30 1359) Negative  1 hr Glucola 110 Genetic screening  normal Anatomy 07-24-2000 normal        Nursing Staff Provider  Office Location  [facility] Dating  LMP  [facility] Model [ ]  Traditional [ ]  Centering [facility] ] Mom+Baby      Language  English Anatomy  normal  Flu Vaccine  Declined 9/5 Genetic/Carrier Screen  NIPS: low risk   AFP:    negative Horizon: alpha thal carrier  TDaP Vaccine   08/27/22 Hgb A1C or  GTT Early: 5.1 Third trimester: normal  COVID Vaccine No   LAB RESULTS   Rhogam  NA Blood Type A/Positive/-- (06/16 0928)   Baby Feeding Plan Breast Antibody Negative (06/16 0928)  Contraception Patch Rubella 3.54 (06/16 0928)  Circumcision Yes BOY RPR Non Reactive (06/16 0928)   Pediatrician  Mom/Baby HBsAg Negative (06/16 0928)   Support Person Tameka(Mom)/FOB-Michael  HCVAb Negative  Prenatal Classes Info given  HIV Non Reactive (06/16 0928)     BTL Consent NA GBS (For PCN allergy, check sensitivities)   VBAC Consent NA Pap n/a           DME Rx 06-17-2001 ] BP cuff [ ]  Weight Scale Waterbirth  [ ]  Class [ ]  Consent [ ]  CNM visit  PHQ9 & GAD7 [x  ] new OB [x  ] 28 weeks  [  ] 36 weeks Induction  [ ]  Orders Entered [ ] Foley Y/N   Prenatal Transfer Tool  Maternal Diabetes: No Genetic Screening: Normal Maternal Ultrasounds/Referrals: Normal Fetal Ultrasounds or other Referrals:  Referred to Materal Fetal Medicine  Maternal Substance Abuse:  No Significant Maternal Medications:  None Significant Maternal Lab Results:  Group B Strep negative Number of Prenatal Visits:greater than 3 verified prenatal visits Other Comments:  None  Results for orders placed or performed during the hospital encounter of 10/30/22 (from the past 24 hour(s))  CBC   Collection Time: 10/30/22  1:00 PM  Result Value Ref Range   WBC 7.7 4.0 - 10.5 K/uL   RBC 4.45 3.87 - 5.11 MIL/uL   Hemoglobin 11.0 (L) 12.0 - 15.0 g/dL   HCT Arly.Keller (L) - %   MCV 80.7 80.0 - 100.0 fL   MCH 24.7 (L) 26.0 - 34.0 pg   MCHC 30.6 30.0 - 36.0 g/dL   RDW (H) - %   Platelets 176 150 - 400 K/uL   nRBC 0.0 0.0 - 0.2 %  Comprehensive metabolic panel   Collection Time: 10/30/22  1:00 PM  Result Value Ref Range   Sodium 139 135 - 145 mmol/L   Potassium 3.8 3.5 - 5.1 mmol/L   Chloride 106 98 - 111 mmol/L   CO2 22 22 - 32 mmol/L    Glucose, Bld 66 (L) 70 - 99 mg/dL   BUN 8 6 - 20 mg/dL  Creatinine, Ser 0.78 0.44 - 1.00 mg/dL   Calcium 9.2 8.9 - 06.2 mg/dL   Total Protein 6.1 (L) 6.5 - 8.1 g/dL   Albumin 3.0 (L) 3.5 - 5.0 g/dL   AST 16 15 - 41 U/L   ALT 11 0 - 44 U/L   Alkaline Phosphatase 172 (H) 38 - 126 U/L   Total Bilirubin 0.5 0.3 - 1.2 mg/dL   GFR, Estimated >69 >48 mL/min   Anion gap 11 5 - 15  Protein / creatinine ratio, urine   Collection Time: 10/30/22  1:00 PM  Result Value Ref Range   Creatinine, Urine 82 mg/dL   Total Protein, Urine 13 mg/dL   Protein Creatinine Ratio 0.16 (H) 0.00 - 0.15 mg/mg[Cre]  Type and screen [facility]   Collection Time: 10/30/22  1:00 PM  Result Value Ref Range   ABO/RH(D) A POS    Antibody Screen NEG    Sample Expiration      11/02/2022,2359 Performed at [facility], [address] [place], [place] [address]   Group B strep by PCR   Collection Time: 10/30/22  1:59 PM   Specimen: Vaginal/Rectal; Genital  Result Value Ref Range   Group B strep by PCR NEGATIVE NEGATIVE  HIV Antibody (routine testing w rflx)   Collection Time: 10/30/22  2:56 PM  Result Value Ref Range   HIV Screen 4th Generation wRfx Non Reactive Non Reactive    Patient Active Problem List   Diagnosis Date Noted   Pre-eclampsia, severe 10/30/2022   Elevated blood pressure affecting pregnancy in third trimester, antepartum 10/28/2022   Preterm uterine contractions 09/27/2022   Alpha thalassemia silent carrier 06/10/2022   Anemia in pregnancy 06/01/2022   Supervision of other normal pregnancy, antepartum 04/17/2022    Assessment/Plan:  Vivica Dobosz is a 21 y.o. G1P0000 at [redacted]w[redacted]d here for IOL   #Labor:IOL 2/2 pre-eclampsia with severe features #Pain: Epidural  #FWB: Cat I #ID:  NA #MOF: Breast #MOC:Patch  #Circ:  Yes   Thressa Sheller, CNM  10/30/2022, 6:11 PM  CNM attestation:  I have seen and examined this patient and agree with above documentation in  the medical student's note.   Intisar Claudio is a 21 y.o. G1P0000 at [redacted]w[redacted]d who came from the office for severe range blood pressures +FM, denies LOF, VB, contractions, vaginal discharge.  PE: Patient Vitals for the past 24 hrs:  BP Pulse Resp SpO2 Height Weight  10/30/22 1800 (!) 140/88 87 14 -- -- --  10/30/22 1737 (!) 128/90 87 -- -- -- --  10/30/22 1734 -- -- 16 -- -- --  10/30/22 1630 123/76 86 -- -- -- --  10/30/22 1601 (!) 124/91 82 16 -- -- --  10/30/22 1530 (!) 143/86 83 -- -- -- --  10/30/22 1522 (!) 143/97 83 -- -- -- --  10/30/22 1453 -- -- 14 -- -- --  10/30/22 1431 127/88 79 -- -- -- --  10/30/22 1415 -- -- -- 97 % -- --  10/30/22 1412 (!) 135/96 80 -- -- -- --  10/30/22 1410 -- -- -- 97 % -- --  10/30/22 1406 (!) 142/101 83 -- -- -- --  10/30/22 1405 -- -- -- 97 % -- --  10/30/22 1400 (!) 151/110 80 16 99 % -- --  10/30/22 1355 -- -- -- 98 % -- --  10/30/22 1353 (!) 154/115 71 -- -- -- --  10/30/22 1309 (!) 144/100 71 -- -- 5\' 2"  (1.575 m)  65.3 kg  10/30/22 1258 (!) 160/109 71 -- -- -- --   Gen: calm comfortable, NAD Resp: normal effort, no distress Heart: Regular rate Abd: Soft, NT, gravid, S=D  FHR: Baseline 125, moderate Variability, pos accels, no decels Toco: irregular   ROS, labs, PMH reviewed  Orders Placed This Encounter  Procedures   Group B strep by PCR   CBC   Comprehensive metabolic panel   RPR   Protein / creatinine ratio, urine   HIV Antibody (routine testing w rflx)   Diet regular Room service appropriate? Yes; Fluid consistency: Thin   Vitals signs per unit policy   Notify physician (specify)   Fetal monitoring per unit policy   Activity as tolerated   Cervical Exam   Measure blood pressure post delivery every 15 min x 1 hour then every 30 min x 1 hour   Fundal check post delivery every 15 min x 1 hour then every 30 min x 1 hour   Apply Labor & Delivery Care Plan   If Rapid HIV test positive or known HIV positive: initiate AZT  orders   May in and out cath x 2 for inability to void   Insert urethral catheter X 1 PRN If Coude Catheter is chosen, qualified resources by campus can be found in the clinical skills nursing procedure for Coude Catheter 1. If straight catheterized > 2 times or patient unable to void post epidural plac...   Refer to Sidebar Report Urinary (Foley) Catheter Indications   Refer to Sidebar Report Post Indwelling Urinary Catheter Removal and Intervention Guidelines   Discontinue foley prior to vaginal delivery   Initiate Carrier Fluid Protocol   Initiate Oral Care Protocol   Evaluate fetal heart rate to establish reassuring pattern prior to initiating Cytotec or Pitocin   Perform a cervical exam prior to initiating Cytotec or Pitocin   Discontinue Pitocin if tachysystole with non-reassuring FHR is present   Notify physician (specify) Tachysystole is defined as more than 5 contractions in a 10-minute time period averaged over a 30-minute window   Initiate intrauterine resuscitation if tachysystole with non-reassuring FHR is present   Notify physician (specify) Tachysystole is defined as more than 5 contractions in a 10-minute time period averaged over a 30-minute window   May administer Terbutaline 0.25 mg SQ x 1 dose if tachysystole with non-reassuring FHR is present   Patient may have epidural placement upon request   Labor Induction   Notify physician (specify) Confirmatory reading of BP> 160/110 15 minutes later   Apply Hypertensive Disorders of Pregnancy Care Plan   Measure blood pressure   Strict intake and output   Full code   Type and screen [facility]   Insert and maintain IV Line   Admit to Inpatient (patient's expected length of stay will be greater than 2 midnights or inpatient only procedure)   Meds ordered this encounter  Medications   DISCONTD: lactated ringers infusion   oxytocin (PITOCIN) IV BOLUS FROM BAG   oxytocin (PITOCIN) IV infusion 30 units in NS 500  mL - Premix   lactated ringers infusion 500-1,000 mL   acetaminophen (TYLENOL) tablet 650 mg   oxyCODONE-acetaminophen (PERCOCET/ROXICET) 5-325 MG per tablet 1 tablet   oxyCODONE-acetaminophen (PERCOCET/ROXICET) 5-325 MG per tablet 2 tablet   ondansetron (ZOFRAN) injection 4 mg   sodium citrate-citric acid (ORACIT) solution 30 mL   lidocaine (PF) (XYLOCAINE) 1 % injection 30 mL   terbutaline (BRETHINE) injection 0.25 mg   oxytocin (PITOCIN)  IV infusion 30 units in NS 500 mL - Premix    Order Specific Question:   Begin infusion at:    Answer:   2 milli-units/min (2 mL/hr)    Order Specific Question:   Increase infusion by:    Answer:   2 milli-units/min (2 mL/hr)   AND Linked Order Group    misoprostol (CYTOTEC) tablet 50 mcg    misoprostol (CYTOTEC) tablet 25 mcg   AND Linked Order Group    labetalol (NORMODYNE) injection 20 mg    labetalol (NORMODYNE) injection 40 mg    labetalol (NORMODYNE) injection 80 mg    hydrALAZINE (APRESOLINE) injection 10 mg   magnesium bolus via infusion 4 g   magnesium sulfate 40 grams in SWI 1000 mL OB infusion   lactated ringers infusion     Assessment: Pre-eclampsia with severe features   Plan: - Admit to labor and delivery  - Anticipate NSVD    Thressa Sheller DNP, CNM  10/30/22  6:11 PM

## 2022-10-31 LAB — COMPREHENSIVE METABOLIC PANEL
ALT: 13 U/L (ref 0–44)
AST: 19 U/L (ref 15–41)
Albumin: 2.9 g/dL — ABNORMAL LOW (ref 3.5–5.0)
Alkaline Phosphatase: 169 U/L — ABNORMAL HIGH (ref 38–126)
Anion gap: 11 (ref 5–15)
BUN: 6 mg/dL (ref 6–20)
CO2: 22 mmol/L (ref 22–32)
Calcium: 8.8 mg/dL — ABNORMAL LOW (ref 8.9–10.3)
Chloride: 103 mmol/L (ref 98–111)
Creatinine, Ser: 0.84 mg/dL (ref 0.44–1.00)
GFR, Estimated: >60 mL/min (ref >60)
Glucose, Bld: 88 mg/dL (ref 70–99)
Potassium: 3.7 mmol/L (ref 3.5–5.1)
Sodium: 136 mmol/L (ref 135–145)
Total Bilirubin: 0.3 mg/dL (ref 0.3–1.2)
Total Protein: 6 g/dL — ABNORMAL LOW (ref 6.5–8.1)

## 2022-10-31 LAB — CBC
HCT: 33.6 % — ABNORMAL LOW (ref 36.0–46.0)
Hemoglobin: 10.5 g/dL — ABNORMAL LOW (ref 12.0–15.0)
MCH: 24.7 pg — ABNORMAL LOW (ref 26.0–34.0)
MCHC: 31.3 g/dL (ref 30.0–36.0)
MCV: 79.1 fL — ABNORMAL LOW (ref 80.0–100.0)
Platelets: 163 10*3/uL (ref 150–400)
RBC: 4.25 MIL/uL (ref 3.87–5.11)
RDW: 20.1 % — ABNORMAL HIGH (ref 11.5–15.5)
WBC: 7.1 10*3/uL (ref 4.0–10.5)
nRBC: 0 % (ref 0.0–0.2)

## 2022-10-31 LAB — CBC WITH DIFFERENTIAL/PLATELET
Abs Immature Granulocytes: 0.05 10*3/uL (ref 0.00–0.07)
Basophils Absolute: 0 10*3/uL (ref 0.0–0.1)
Basophils Relative: 0 %
Eosinophils Absolute: 0 10*3/uL (ref 0.0–0.5)
Eosinophils Relative: 0 %
HCT: 34.1 % — ABNORMAL LOW (ref 36.0–46.0)
Hemoglobin: 10.7 g/dL — ABNORMAL LOW (ref 12.0–15.0)
Immature Granulocytes: 1 %
Lymphocytes Relative: 14 %
Lymphs Abs: 1.2 10*3/uL (ref 0.7–4.0)
MCH: 24.8 pg — ABNORMAL LOW (ref 26.0–34.0)
MCHC: 31.4 g/dL (ref 30.0–36.0)
MCV: 79.1 fL — ABNORMAL LOW (ref 80.0–100.0)
Monocytes Absolute: 0.7 10*3/uL (ref 0.1–1.0)
Monocytes Relative: 8 %
Neutro Abs: 6.8 10*3/uL (ref 1.7–7.7)
Neutrophils Relative %: 77 %
Platelets: 178 10*3/uL (ref 150–400)
RBC: 4.31 MIL/uL (ref 3.87–5.11)
RDW: 19.9 % — ABNORMAL HIGH (ref 11.5–15.5)
WBC: 8.8 10*3/uL (ref 4.0–10.5)
nRBC: 0 % (ref 0.0–0.2)

## 2022-10-31 LAB — MAGNESIUM
Magnesium: 6.2 mg/dL (ref 1.7–2.4)
Magnesium: 7 mg/dL (ref 1.7–2.4)

## 2022-10-31 LAB — RPR: RPR Ser Ql: NONREACTIVE

## 2022-10-31 MED ORDER — FENTANYL CITRATE (PF) 100 MCG/2ML IJ SOLN
INTRAMUSCULAR | Status: AC
  Administered 2022-11-01: 100 ug via INTRAVENOUS
  Filled 2022-10-31: qty 2

## 2022-10-31 MED ORDER — OXYTOCIN-SODIUM CHLORIDE 30-0.9 UT/500ML-% IV SOLN
1.0000 m[IU]/min | INTRAVENOUS | Status: DC
  Administered 2022-10-31: 1 m[IU]/min via INTRAVENOUS

## 2022-10-31 MED ORDER — FENTANYL CITRATE (PF) 100 MCG/2ML IJ SOLN
100.0000 ug | INTRAMUSCULAR | Status: DC | PRN
  Administered 2022-10-31 – 2022-11-01 (×5): 100 ug via INTRAVENOUS
  Filled 2022-10-31 (×5): qty 2

## 2022-10-31 NOTE — Progress Notes (Signed)
Discussed starting pitocin if no cervical change at next exam. The family expressed their concerns about restarting pitocin, the induction process, and attempting to avoid a c-section. All questions on these matters were answered appropriately. Reiterated the plan of safe delivery for mother and baby.   Lavonda Jumbo, DO OB Fellow, [facility], Center for [facility] 10/31/2022, 2:11 PM

## 2022-10-31 NOTE — Progress Notes (Signed)
Lori Silva is a 21 y.o. G1P0000 at [redacted]w[redacted]d   Subjective: Deeply asleep prior to staff entering room. Support x 2 at bedside and engaged in care, attentive to patient  Objective: BP 121/73   Pulse 82   Temp 98 F (36.7 C) (Axillary)   Resp 15   Ht 5\' 2"  (1.575 m)   Wt 65.3 kg   LMP 02/13/2022   SpO2 97%   BMI 26.34 kg/m  I/O last 3 completed shifts: In: 3885.3 [P.O.:1336; I.V.:2499.3; IV Piggyback:50] Out: 3650 [Urine:3650] No intake/output data recorded.  FHT:  Baseline 110, mod var, no accels, deep variable at 0816 followed by slow return to baseline  UC:   rare SVE:   Dilation: 3 Effacement (%): 50 Station: -2 Exam by:: Roy, CNM  Labs: Lab Results  Component Value Date   WBC 8.8 10/31/2022   HGB 10.7 (L) 10/31/2022   HCT 34.1 (L) 10/31/2022   MCV 79.1 (L) 10/31/2022   PLT 178 10/31/2022    Assessment / Plan: --CNM at bedside at 0815, emergently requested due to fetal decel with slow return to baseline --Pit off, IV fluid bolus initiated, patient in right lateral. All interventions in place when I entered room --Terb given at 0819 --Cervix unchanged from previous, no detected cord on my cervical exam --Discussed fetal tolerance, removal of all methods with patient and support team --Will leave Pitocin off to allow baby to recover --Update provided to Dr. 14/12/2021, CNM 10/31/2022, 8:29 AM

## 2022-10-31 NOTE — Progress Notes (Signed)
Labor Progress Note Lori Silva is a 21 y.o. G1P0000 at [redacted]w[redacted]d presented for IOL.   S: No acute concerns.   O:  BP (!) 142/94   Pulse 95   Temp 98.5 F (36.9 C) (Axillary)   Resp 17   Ht 5\' 2"  (1.575 m)   Wt 65.3 kg   LMP 02/13/2022   SpO2 97%   BMI 26.34 kg/m  EFM: 105bpm/moderate/+accels, no decels  CVE: Dilation: 4 Effacement (%): 50 Station: -3 Presentation: Vertex Exam by:: Dr. [email or web address]   A&P: 21 y.o. G1P0000 [redacted]w[redacted]d here for IOL 2/2 preE w/ SF #Labor:s/p FB x2. Consider pit v. Cytotec. #Pain: Maternally supported, planning for epidural #FWB: Cat II, appears to continue to drop baseline. Continue to monitor.  #GBS negative  preE w/ SF (BPs) BP 142/94 -Continue mag -Continue BP protocol  Zilda No Autry-Lott, DO 8:11 PM

## 2022-10-31 NOTE — Progress Notes (Signed)
Labor Progress Note Lori Silva is a 21 y.o. G1P0000 at [redacted]w[redacted]d presented for IOL.   S: Resting comfortably with no acute concerns or questions.  O:  BP (!) 140/96   Pulse 93   Temp 98.9 F (37.2 C) (Axillary)   Resp 17   Ht 5\' 2"  (1.575 m)   Wt 65.3 kg   LMP 02/13/2022   SpO2 100%   BMI 26.34 kg/m  EFM: 105bpm/moderate/+accels, no decels  CVE: Dilation: 4 Effacement (%): 50 Station: -3 Presentation: Vertex Exam by:: Dr. [email or web address]   A&P: 21 y.o. G1P0000 [redacted]w[redacted]d here for IOL 2/2 preE w/ SF #Labor:s/p FB x2.  Discussed Pitocin versus Cytotec at length.  Discussed that with Pitocin we have the option to cut off if needed for fetal intolerance versus Cytotec which you cannot remove.  Patient elects for Pitocin.  We will start one by one and increase to 2 x 2 at 6 #Pain: Maternally supported, planning for epidural #FWB: Category 2 due to baseline.  Moderate variability with accelerations and no decelerations. #GBS negative  preE w/ SF (BPs) BP 140/96 -Continue mag -Continue BP protocol  [redacted]w[redacted]d, MD 10:45 PM

## 2022-10-31 NOTE — Progress Notes (Signed)
Lori Silva is a 21 y.o. G1P0000 at [redacted]w[redacted]d   Subjective: Sleepy, FOB sitting at end of bed, talking to patient  Objective: BP 119/70   Pulse (!) 112   Temp 98 F (36.7 C) (Axillary)   Resp 14   Ht 5\' 2"  (1.575 m)   Wt 65.3 kg   LMP 02/13/2022   SpO2 97%   BMI 26.34 kg/m  I/O last 3 completed shifts: In: 3885.3 [P.O.:1336; I.V.:2499.3; IV Piggyback:50] Out: 3650 [Urine:3650] Total I/O In: 615 [P.O.:240; I.V.:375] Out: 600 [Urine:600]  FHR: 115 bpm, variability: moderate,  accelerations:  Present,  decelerations:  Absent UC:   occasional SVE:   Dilation: 3 Effacement (%): 50 Station: -2 Exam by:: [email or web address], CNM  Labs: Lab Results  Component Value Date   WBC 7.1 10/31/2022   HGB 10.5 (L) 10/31/2022   HCT 33.6 (L) 10/31/2022   MCV 79.1 (L) 10/31/2022   PLT 163 10/31/2022    Assessment / Plan: --CNM at bedside at 1100 to discuss restarting Pitocin and possibly placing epidural prior to that --Discussed reassuring fetal tracing, Cat I for > 1 hour --Reviewed justification for placing epidural prior to restarting --Patient requests additional time to consider options --CNM will return to bedside at [address] CNM 10/31/2022, 11:05 AM

## 2022-10-31 NOTE — Progress Notes (Signed)
Mg level back at 6.2. patient feels better following IV pepcid. - restart Mg at 2g/hr  Lori Evens MD MPH OB Fellow, Faculty Practice [facility], Center for [facility] 10/31/2022

## 2022-10-31 NOTE — Progress Notes (Signed)
Patient ID: Lori Silva, female   DOB: [birth year], 21 y.o.   MRN: [id number]   Labor status reviewed with Velva Harman. Cat I tracing. Pit off since 0815. Given previous decel at 2 milliunits of Pitocin will wait to restart Pitocin until Attending is finished in OR.  Clayton Bibles, CNM 10/31/22 10:40 AM

## 2022-10-31 NOTE — Progress Notes (Signed)
Labor Progress Note Sanjuanita Condrey is a 21 y.o. G1P0000 at [redacted]w[redacted]d presented for IOL due to severely elevated BP. S: Patient reporting she is still uncomfortable and was not able to get much sleep.   O:  BP (!) 143/90   Pulse 97   Temp 98 F (36.7 C) (Axillary)   Resp 15   Ht 5\' 2"  (1.575 m)   Wt 65.3 kg   LMP 02/13/2022   SpO2 97%   BMI 26.34 kg/m  EFM: 110bpm/moderate/+accels, no decels  CVE: Dilation: 3 Effacement (%): 50 Station: -2 Presentation: Vertex Exam by:: [email or web address] RN-----------------------------------   A&P: 21 y.o. G1P0000 [redacted]w[redacted]d with IOL for pre-eclampsia with severe features #Labor: Progressing well. S/p cytotec x2 rounds. FB placed, but fell out closer to 2.5cm dilation, still making change. Will start pit now. #Pain: IV fentaynyl PRN, epidural if requested #FWB: Category 1 #GBS negative  Pre-eclampsia with SF:  Currently on Mg. IV pepcid for chest pain, given dyspeptic nature of symptoms.  Continue to monitor  Tayden Nichelson N. [redacted]w[redacted]d, MD 7:25 AM

## 2022-10-31 NOTE — Progress Notes (Signed)
Labor Progress Note Lori Silva is a 21 y.o. G1P0000 at [redacted]w[redacted]d presented for IOL.   S: No acute concerns.   O:  BP (!) 134/91   Pulse 94   Temp 98 F (36.7 C) (Oral)   Resp 16   Ht 5\' 2"  (1.575 m)   Wt 65.3 kg   LMP 02/13/2022   SpO2 97%   BMI 26.34 kg/m  EFM: 115bpm/moderate/+accels, no decels  CVE: Dilation: 2 Effacement (%): Thick Station: -3 Presentation: Vertex Exam by:: autry lott   A&P: 21 y.o. G1P0000 [redacted]w[redacted]d here for IOL 2/2 preE w/ SF #Labor: s/p FB, pit, AROM. Grossly unchanged for last cervical exam. Does not want to start pitocin at this time. Would like to try FB again. FB placed. Discussed starting pitocin when FB out. Ideally would have started low dose pitocin with FB in but the patient is reluctant to introduce anything that could decrease the baby's HR or cause a decel again.  #Pain: Maternally supported, planning for epidural #FWB: Cat I  #GBS negative  preE w/ SF (BPs) BP 134/91 -Continue mag -Continue BP protocol  Aribella Vavra Autry-Lott, DO 4:15 PM

## 2022-11-01 ENCOUNTER — Encounter (HOSPITAL_COMMUNITY): Payer: Self-pay | Admitting: Obstetrics & Gynecology

## 2022-11-01 ENCOUNTER — Inpatient Hospital Stay (HOSPITAL_COMMUNITY): Payer: Medicaid Other | Admitting: Anesthesiology

## 2022-11-01 DIAGNOSIS — O1414 Severe pre-eclampsia complicating childbirth: Secondary | ICD-10-CM

## 2022-11-01 DIAGNOSIS — Z3A37 37 weeks gestation of pregnancy: Secondary | ICD-10-CM

## 2022-11-01 LAB — CBC WITH DIFFERENTIAL/PLATELET
Abs Immature Granulocytes: 0.09 10*3/uL — ABNORMAL HIGH (ref 0.00–0.07)
Abs Immature Granulocytes: 0.06 10*3/uL (ref 0.00–0.07)
Basophils Absolute: 0 10*3/uL (ref 0.0–0.1)
Basophils Absolute: 0 10*3/uL (ref 0.0–0.1)
Basophils Relative: 0 %
Basophils Relative: 0 %
Eosinophils Absolute: 0 10*3/uL (ref 0.0–0.5)
Eosinophils Absolute: 0 10*3/uL (ref 0.0–0.5)
Eosinophils Relative: 0 %
Eosinophils Relative: 0 %
HCT: 35.5 % — ABNORMAL LOW (ref 36.0–46.0)
HCT: 31.7 % — ABNORMAL LOW (ref 36.0–46.0)
Hemoglobin: 11 g/dL — ABNORMAL LOW (ref 12.0–15.0)
Hemoglobin: 10.2 g/dL — ABNORMAL LOW (ref 12.0–15.0)
Immature Granulocytes: 1 %
Immature Granulocytes: 0 %
Lymphocytes Relative: 17 %
Lymphocytes Relative: 4 %
Lymphs Abs: 1.6 10*3/uL (ref 0.7–4.0)
Lymphs Abs: 0.6 10*3/uL — ABNORMAL LOW (ref 0.7–4.0)
MCH: 24.7 pg — ABNORMAL LOW (ref 26.0–34.0)
MCH: 25.4 pg — ABNORMAL LOW (ref 26.0–34.0)
MCHC: 31 g/dL (ref 30.0–36.0)
MCHC: 32.2 g/dL (ref 30.0–36.0)
MCV: 79.6 fL — ABNORMAL LOW (ref 80.0–100.0)
MCV: 78.9 fL — ABNORMAL LOW (ref 80.0–100.0)
Monocytes Absolute: 0.7 10*3/uL (ref 0.1–1.0)
Monocytes Absolute: 1 10*3/uL (ref 0.1–1.0)
Monocytes Relative: 7 %
Monocytes Relative: 7 %
Neutro Abs: 6.9 10*3/uL (ref 1.7–7.7)
Neutro Abs: 14 10*3/uL — ABNORMAL HIGH (ref 1.7–7.7)
Neutrophils Relative %: 75 %
Neutrophils Relative %: 89 %
Platelets: 192 10*3/uL (ref 150–400)
Platelets: 168 10*3/uL (ref 150–400)
RBC: 4.46 MIL/uL (ref 3.87–5.11)
RBC: 4.02 MIL/uL (ref 3.87–5.11)
RDW: 20 % — ABNORMAL HIGH (ref 11.5–15.5)
RDW: 19.8 % — ABNORMAL HIGH (ref 11.5–15.5)
WBC: 9.2 10*3/uL (ref 4.0–10.5)
WBC: 15.7 10*3/uL — ABNORMAL HIGH (ref 4.0–10.5)
nRBC: 0 % (ref 0.0–0.2)
nRBC: 0 % (ref 0.0–0.2)

## 2022-11-01 LAB — MAGNESIUM
Magnesium: 5.5 mg/dL — ABNORMAL HIGH (ref 1.7–2.4)
Magnesium: 5.2 mg/dL — ABNORMAL HIGH (ref 1.7–2.4)

## 2022-11-01 LAB — COMPREHENSIVE METABOLIC PANEL
ALT: 10 U/L (ref 0–44)
ALT: 8 U/L (ref 0–44)
AST: 17 U/L (ref 15–41)
AST: 18 U/L (ref 15–41)
Albumin: 2.6 g/dL — ABNORMAL LOW (ref 3.5–5.0)
Albumin: 2.2 g/dL — ABNORMAL LOW (ref 3.5–5.0)
Alkaline Phosphatase: 156 U/L — ABNORMAL HIGH (ref 38–126)
Alkaline Phosphatase: 144 U/L — ABNORMAL HIGH (ref 38–126)
Anion gap: 8 (ref 5–15)
Anion gap: 5 (ref 5–15)
BUN: <5 mg/dL — ABNORMAL LOW (ref 6–20)
BUN: <5 mg/dL — ABNORMAL LOW (ref 6–20)
CO2: 20 mmol/L — ABNORMAL LOW (ref 22–32)
CO2: 22 mmol/L (ref 22–32)
Calcium: 7.9 mg/dL — ABNORMAL LOW (ref 8.9–10.3)
Calcium: 7.4 mg/dL — ABNORMAL LOW (ref 8.9–10.3)
Chloride: 105 mmol/L (ref 98–111)
Chloride: 104 mmol/L (ref 98–111)
Creatinine, Ser: 0.8 mg/dL (ref 0.44–1.00)
Creatinine, Ser: 1.07 mg/dL — ABNORMAL HIGH (ref 0.44–1.00)
GFR, Estimated: >60 mL/min (ref >60)
GFR, Estimated: >60 mL/min (ref >60)
Glucose, Bld: 92 mg/dL (ref 70–99)
Glucose, Bld: 106 mg/dL — ABNORMAL HIGH (ref 70–99)
Potassium: 3.8 mmol/L (ref 3.5–5.1)
Potassium: 4.3 mmol/L (ref 3.5–5.1)
Sodium: 133 mmol/L — ABNORMAL LOW (ref 135–145)
Sodium: 131 mmol/L — ABNORMAL LOW (ref 135–145)
Total Bilirubin: 0.6 mg/dL (ref 0.3–1.2)
Total Bilirubin: 0.7 mg/dL (ref 0.3–1.2)
Total Protein: 5.4 g/dL — ABNORMAL LOW (ref 6.5–8.1)
Total Protein: 5.1 g/dL — ABNORMAL LOW (ref 6.5–8.1)

## 2022-11-01 LAB — CULTURE, BETA STREP (GROUP B ONLY): Strep Gp B Culture: NEGATIVE

## 2022-11-01 MED ORDER — SENNOSIDES-DOCUSATE SODIUM 8.6-50 MG PO TABS
2.0000 | ORAL_TABLET | Freq: Every day | ORAL | Status: DC
  Administered 2022-11-02 – 2022-11-03 (×2): 2 via ORAL
  Filled 2022-11-01 (×2): qty 2

## 2022-11-01 MED ORDER — ONDANSETRON HCL 4 MG PO TABS: 4.0000 mg | ORAL_TABLET | ORAL | Status: DC | PRN

## 2022-11-01 MED ORDER — OXYTOCIN-SODIUM CHLORIDE 30-0.9 UT/500ML-% IV SOLN: 1.0000 m[IU]/min | INTRAVENOUS | Status: DC

## 2022-11-01 MED ORDER — BUPIVACAINE HCL (PF) 0.25 % IJ SOLN
INTRAMUSCULAR | Status: DC | PRN
  Administered 2022-11-01 (×2): 3 mL via EPIDURAL

## 2022-11-01 MED ORDER — TERBUTALINE SULFATE 1 MG/ML IJ SOLN
INTRAMUSCULAR | Status: AC
  Filled 2022-11-01: qty 1

## 2022-11-01 MED ORDER — COCONUT OIL OIL
1.0000 | TOPICAL_OIL | Status: DC | PRN
  Administered 2022-11-03: 1 via TOPICAL

## 2022-11-01 MED ORDER — ACETAMINOPHEN 325 MG PO TABS
650.0000 mg | ORAL_TABLET | ORAL | Status: DC | PRN
  Administered 2022-11-01: 650 mg via ORAL
  Filled 2022-11-01: qty 2

## 2022-11-01 MED ORDER — IBUPROFEN 600 MG PO TABS
600.0000 mg | ORAL_TABLET | Freq: Four times a day (QID) | ORAL | Status: DC
  Administered 2022-11-01 – 2022-11-03 (×6): 600 mg via ORAL
  Filled 2022-11-01 (×7): qty 1

## 2022-11-01 MED ORDER — BENZOCAINE-MENTHOL 20-0.5 % EX AERO
1.0000 | INHALATION_SPRAY | CUTANEOUS | Status: DC | PRN
  Administered 2022-11-03: 1 via TOPICAL
  Filled 2022-11-01: qty 56

## 2022-11-01 MED ORDER — PRENATAL MULTIVITAMIN CH
1.0000 | ORAL_TABLET | Freq: Every day | ORAL | Status: DC
  Administered 2022-11-02 – 2022-11-03 (×2): 1 via ORAL
  Filled 2022-11-01 (×2): qty 1

## 2022-11-01 MED ORDER — MAGNESIUM SULFATE 40 GM/1000ML IV SOLN
1.0000 g/h | INTRAVENOUS | Status: AC
  Administered 2022-11-02: 1 g/h via INTRAVENOUS
  Filled 2022-11-01: qty 1000

## 2022-11-01 MED ORDER — DIPHENHYDRAMINE HCL 25 MG PO CAPS: 25.0000 mg | ORAL_CAPSULE | Freq: Four times a day (QID) | ORAL | Status: DC | PRN

## 2022-11-01 MED ORDER — TRANEXAMIC ACID-NACL 1000-0.7 MG/100ML-% IV SOLN
1000.0000 mg | INTRAVENOUS | Status: AC
  Administered 2022-11-01: 1000 mg via INTRAVENOUS

## 2022-11-01 MED ORDER — DIBUCAINE (PERIANAL) 1 % EX OINT: 1.0000 | TOPICAL_OINTMENT | CUTANEOUS | Status: DC | PRN

## 2022-11-01 MED ORDER — POLYETHYLENE GLYCOL 3350 17 G PO PACK
17.0000 g | PACK | Freq: Every day | ORAL | Status: DC
  Filled 2022-11-01: qty 1

## 2022-11-01 MED ORDER — MISOPROSTOL 200 MCG PO TABS
ORAL_TABLET | ORAL | Status: AC
  Filled 2022-11-01: qty 4

## 2022-11-01 MED ORDER — ONDANSETRON HCL 4 MG/2ML IJ SOLN: 4.0000 mg | INTRAMUSCULAR | Status: DC | PRN

## 2022-11-01 MED ORDER — NIFEDIPINE ER OSMOTIC RELEASE 30 MG PO TB24
30.0000 mg | ORAL_TABLET | Freq: Every day | ORAL | Status: DC
  Administered 2022-11-02 – 2022-11-03 (×2): 30 mg via ORAL
  Filled 2022-11-01 (×2): qty 1

## 2022-11-01 MED ORDER — TETANUS-DIPHTH-ACELL PERTUSSIS 5-2.5-18.5 LF-MCG/0.5 IM SUSY: 0.5000 mL | PREFILLED_SYRINGE | Freq: Once | INTRAMUSCULAR | Status: DC

## 2022-11-01 MED ORDER — WITCH HAZEL-GLYCERIN EX PADS
1.0000 | MEDICATED_PAD | CUTANEOUS | Status: DC | PRN
  Administered 2022-11-03: 1 via TOPICAL

## 2022-11-01 MED ORDER — OXYTOCIN-SODIUM CHLORIDE 30-0.9 UT/500ML-% IV SOLN
10.0000 [IU]/h | INTRAVENOUS | Status: DC
  Administered 2022-11-01: 10 [IU]/h via INTRAVENOUS

## 2022-11-01 MED ORDER — LACTATED RINGERS IV SOLN: INTRAVENOUS | Status: AC

## 2022-11-01 MED ORDER — FUROSEMIDE 20 MG PO TABS
20.0000 mg | ORAL_TABLET | Freq: Every day | ORAL | Status: DC
  Administered 2022-11-02 – 2022-11-03 (×2): 20 mg via ORAL
  Filled 2022-11-01 (×2): qty 1

## 2022-11-01 MED ORDER — TRANEXAMIC ACID-NACL 1000-0.7 MG/100ML-% IV SOLN
INTRAVENOUS | Status: AC
  Filled 2022-11-01: qty 100

## 2022-11-01 MED ORDER — ZOLPIDEM TARTRATE 5 MG PO TABS: 5.0000 mg | ORAL_TABLET | Freq: Every evening | ORAL | Status: DC | PRN

## 2022-11-01 MED ORDER — LIDOCAINE-EPINEPHRINE (PF) 2 %-1:200000 IJ SOLN
INTRAMUSCULAR | Status: DC | PRN
  Administered 2022-11-01: 3 mL via EPIDURAL

## 2022-11-01 MED ORDER — SIMETHICONE 80 MG PO CHEW: 80.0000 mg | CHEWABLE_TABLET | ORAL | Status: DC | PRN

## 2022-11-01 NOTE — Anesthesia Preprocedure Evaluation (Signed)
Anesthesia Evaluation  Patient identified by MRN, date of birth, ID band Patient awake    Reviewed: Allergy & Precautions, Patient's Chart, lab work & pertinent test results  History of Anesthesia Complications Negative for: history of anesthetic complications  Airway Mallampati: I       Dental   Pulmonary neg pulmonary ROS   breath sounds clear to auscultation       Cardiovascular hypertension,  Rhythm:Regular  preE   Neuro/Psych negative neurological ROS  negative psych ROS   GI/Hepatic negative GI ROS, Neg liver ROS,,,  Endo/Other  negative endocrine ROS    Renal/GU negative Renal ROS     Musculoskeletal   Abdominal   Peds  Hematology  (+) Blood dyscrasia, anemia Lab Results      Component                Value               Date                      WBC                      9.2                 11/01/2022                HGB                      11.0 (L)            11/01/2022                HCT                      35.5 (L)            11/01/2022                MCV                      79.6 (L)            11/01/2022                PLT                      192                 11/01/2022              Anesthesia Other Findings   Reproductive/Obstetrics (+) Pregnancy                             Anesthesia Physical Anesthesia Plan  ASA: 2  Anesthesia Plan: Combined Spinal and Epidural   Post-op Pain Management:    Induction:   PONV Risk Score and Plan: 2 and Treatment may vary due to age or medical condition  Airway Management Planned: Natural Airway  Additional Equipment: None  Intra-op Plan:   Post-operative Plan:   Informed Consent: I have reviewed the patients History and Physical, chart, labs and discussed the procedure including the risks, benefits and alternatives for the proposed anesthesia with the patient or authorized representative who has indicated his/her  understanding and acceptance.       Plan Discussed with:   Anesthesia Plan Comments:  Anesthesia Quick Evaluation  

## 2022-11-01 NOTE — Progress Notes (Addendum)
Labor Progress Note Lori Silva is a 21 y.o. G1P0000 at [redacted]w[redacted]d presented for IOL.   S: No acute concerns.   O:  BP (!) 141/98   Pulse 95   Temp 98.7 F (37.1 C) (Axillary)   Resp 15   Ht 5\' 2"  (1.575 m)   Wt 65.3 kg   LMP 02/13/2022   SpO2 99%   BMI 26.34 kg/m  EFM: 120bpm/moderate/+accels, no decels  CVE: Dilation: 4 Effacement (%): 80 Station: -1 Presentation: Vertex Exam by:: Dr. [email or web address]   A&P: 21 y.o. G1P0000 [redacted]w[redacted]d here for IOL 2/2 preE w/ SF #Labor: Next cervical exam in 1 hour. Suspect baby is asynclitic or OP given coupleting contraction pattern. Discussed this with patient. IUPC placed, MVUs 310 on 10 of pit. She has been adequate since 0657 this morning, pitocin was started at 2039 last night.  #Pain: Epidural #FWB: Cat I #GBS negative  preE w/ SF (BPs) BP 145/101. CMP stable -Continue mag -Continue BP protocol  Lori Neis Autry-Lott, DO 10:17 AM

## 2022-11-01 NOTE — Lactation Note (Signed)
This note was copied from a baby's chart. Lactation Consultation Note  Patient Name: Lori Silva IFOYD'X Date: 11/01/2022   Age:19 hours Mom is resting not feeling well. RN suggested not visit at this time. Maternal Data    Feeding    LATCH Score                    Lactation Tools Discussed/Used    Interventions    Discharge    Consult Status      Charyl Dancer 11/01/2022, 7:52 PM

## 2022-11-01 NOTE — Progress Notes (Signed)
Labor Progress Note Lori Silva is a 21 y.o. G1P0000 at [redacted]w[redacted]d presented for IOL for pre-e with SF.  S: Some pelvic discomfort  O:  BP (!) 151/101   Pulse 97   Temp 97.9 F (36.6 C)   Resp 15   Ht 5\' 2"  (1.575 m)   Wt 65.3 kg   LMP 02/13/2022   SpO2 99%   BMI 26.34 kg/m  EFM: 120bpm/mod variability/+accels, -decels  CVE: Dilation: 6 Effacement (%): 90 Station: 0, Plus 1 Presentation: Vertex Exam by:: [email or web address]   A&P: 21 y.o. G1P0000 [redacted]w[redacted]d here for IOL for pre-e with SF. #Labor: Progressing. Continue current management.  #Pain: Epidural #FWB: Cat 1 #GBS negative #Pre-e with SF: BP elevated but not in severe range. Mg running. Continue Mg, BP protocol.  [redacted]w[redacted]d, DO OB Fellow, Faculty [facility], Center for [facility] 11/01/2022, 3:19 PM

## 2022-11-01 NOTE — Progress Notes (Signed)
Labor Progress Note Lori Silva is a 21 y.o. G1P0000 at [redacted]w[redacted]d presented for IOL.   S: Sleeping when I enter the room/   O:  BP (!) 141/96   Pulse 84   Temp 97.6 F (36.4 C) (Axillary)   Resp 19   Ht 5\' 2"  (1.575 m)   Wt 65.3 kg   LMP 02/13/2022   SpO2 100%   BMI 26.34 kg/m  EFM: 110bpm/moderate/+accels, no decels  CVE: Dilation: 4 Effacement (%): 80 Station: -2, -1 Presentation: Vertex Exam by:: Dr. [email or web address]   A&P: 21 y.o. G1P0000 [redacted]w[redacted]d here for IOL 2/2 preE w/ SF #Labor:s/p FB x2.  Currently on pitocin and making slow change thinning out. Attempted IUPC but patient could not tolerate it. Will get epidural and place after she is comfortable.  #Pain: Maternally supported, planning for epidural #FWB: Category 2 due to baseline.  Moderate variability with accelerations and no decelerations. #GBS negative  preE w/ SF (BPs) BP 140/96 -Continue mag -Continue BP protocol  [redacted]w[redacted]d, MD 4:50 AM

## 2022-11-01 NOTE — Lactation Note (Signed)
This note was copied from a baby's chart. Lactation Consultation Note  Patient Name: Lori Silva HQPRF'F Date: 11/01/2022 Reason for consult: Initial assessment;Primapara;Early term 37-38.6wks Age:19 hours Assisted baby to the breast. He BF great. Noted milk transfer. Mom excited. Praised mom. Mom shown how to use DEBP & how to disassemble, clean, & reassemble parts. Pump q3hr after BF. Mom needs smaller flange than 21. Mom has DEBP. Newborn feeding habits, behavior, STS, I&O, positioning, support, body alignment, milk storage reviewed. Mom encouraged to feed baby 8-12 times/24 hours and with feeding cues.  Answered questions.  Encouraged to call for assistance as needed.  Maternal Data Has patient been taught Hand Expression?: Yes Does the patient have breastfeeding experience prior to this delivery?: No  Feeding Mother's Current Feeding Choice: Breast Milk and Formula  LATCH Score Latch: Grasps breast easily, tongue down, lips flanged, rhythmical sucking.  Audible Swallowing: Spontaneous and intermittent  Type of Nipple: Everted at rest and after stimulation (short shaft)  Comfort (Breast/Nipple): Filling, red/small blisters or bruises, mild/mod discomfort (breast full feeling)  Hold (Positioning): Full assist, staff holds infant at breast  LATCH Score: 7   Lactation Tools Discussed/Used Tools: Pump;Flanges Flange Size: 21 Breast pump type: Double-Electric Breast Pump Pump Education: Setup, frequency, and cleaning;Milk Storage Reason for Pumping: supplementation Pumping frequency: q3h  Interventions Interventions: Breast feeding basics reviewed;Adjust position;DEBP;Assisted with latch;Support pillows;Skin to skin;Position options;Breast massage;Hand express;LC Services brochure;Breast compression  Discharge    Consult Status Consult Status: Follow-up Date: 11/02/22 Follow-up type: In-patient    Charyl Dancer 11/01/2022, 10:01 PM

## 2022-11-01 NOTE — Progress Notes (Addendum)
Labor Progress Note Lori Silva is a 21 y.o. G1P0000 at [redacted]w[redacted]d presented for IOL for pre-e with SF. S: Patient is resting comfortably. She is feeling sleepy.  O:  BP (!) 152/109   Pulse 98   Temp 97.9 F (36.6 C)   Resp 16   Ht 5\' 2"  (1.575 m)   Wt 65.3 kg   LMP 02/13/2022   SpO2 99%   BMI 26.34 kg/m  EFM: 130s BPM/mod variability/+accels, -decels  CVE: Dilation: 4.5 Effacement (%): 80 Station: -1 Presentation: Vertex Exam by:: [email or web address]   A&P: 20 y.o. G1P0000 [redacted]w[redacted]d here for IOL for pre-e with SF. #Labor: Continues to progress slowly, still in latent labor. Discussed potential for LTCS should progression not occur. IUPC with adequate contractions though coupled/quadrupled without FHT anomalies, pitocin running. #Pain: Epidural #FWB: Cat 1 #GBS negative #Pre-e with SF: BP elevated but not in severe range. Mg running. Continue Mg, BP protocol.  [redacted]w[redacted]d, MD Center for [facility], [facility] 11:21 AM

## 2022-11-01 NOTE — Discharge Summary (Signed)
Postpartum Discharge Summary  Date of Service updated***     Patient Name: Lori Silva DOB: November 16, 2001 MRN: [id number]  Date of admission: 10/30/2022 Delivery date:11/01/2022  Delivering provider: Gerlene Fee  Date of discharge: 11/01/2022  Admitting diagnosis: Pre-eclampsia, severe [O14.10] Intrauterine pregnancy: [redacted]w[redacted]d    Secondary diagnosis:  Principal Problem:   Pre-eclampsia, severe Active Problems:   Supervision of other normal pregnancy, antepartum   Anemia in pregnancy   Alpha thalassemia silent carrier  Additional problems: ***    Discharge diagnosis: Term Pregnancy Delivered, Preeclampsia (severe), and Anemia                                              Post partum procedures:{Postpartum procedures:23558} Augmentation: AROM, Pitocin, and IP Foley Complications: None  Hospital course: Induction of Labor With Vaginal Delivery   21y.o. yo G1P0000 at 356w2das admitted to the hospital 10/30/2022 for induction of labor.  Indication for induction: Preeclampsia.  Patient had an uncomplicated labor course. Membrane Rupture Time/Date: 11:46 AM ,10/31/2022   Delivery Method:Vaginal, Spontaneous  Episiotomy: None  Lacerations:  2nd degree  Details of delivery can be found in separate delivery note.  Patient had a postpartum course complicated by***. Patient is discharged home 11/01/22.  Newborn Data: Birth date:11/01/2022  Birth time:6:46 PM  Gender:Female  Living status:Living  Apgars:7 ,9  Weight:   Magnesium Sulfate received: Yes: Seizure prophylaxis BMZ received: No Rhophylac:No MMR:No T-DaP:Given prenatally Flu: {F{PRX:45859}ransfusion:{Transfusion received:30440034}  Physical exam  Vitals:   11/01/22 1801 11/01/22 1901 11/01/22 1916 11/01/22 1930  BP: (!) 140/89 (!) 136/102 (!) 137/111 (!) 137/95  Pulse: (!) 106 (!) 112 (!) 185 (!) 102  Resp:   16 15  Temp:      TempSrc:      SpO2:      Weight:      Height:       General: {Exam;  general:21111117} Lochia: {Desc; appropriate/inappropriate:30686::"appropriate"} Uterine Fundus: {Desc; firm/soft:30687} Incision: {Exam; incision:21111123} DVT Evaluation: {Exam; dvt:2111122} Labs: Lab Results  Component Value Date   WBC 9.2 11/01/2022   HGB 11.0 (L) 11/01/2022   HCT 35.5 (L) 11/01/2022   MCV 79.6 (L) 11/01/2022   PLT 192 11/01/2022      Latest Ref Rng & Units 11/01/2022    1:08 AM  CMP  Glucose 70 - 99 mg/dL 92   BUN 6 - 20 mg/dL <5   Creatinine 0.44 - 1.00 mg/dL 0.80   Sodium 135 - 145 mmol/L 133   Potassium 3.5 - 5.1 mmol/L 3.8   Chloride 98 - 111 mmol/L 105   CO2 22 - 32 mmol/L 20   Calcium 8.9 - 10.3 mg/dL 7.9   Total Protein 6.5 - 8.1 g/dL 5.4   Total Bilirubin 0.3 - 1.2 mg/dL 0.6   Alkaline Phos 38 - 126 U/L 156   AST 15 - 41 U/L 17   ALT 0 - 44 U/L 10    Edinburgh Score:     No data to display           After visit meds:  Allergies as of 11/01/2022       Reactions   Fruit & Vegetable Daily [nutritional Supplements] Itching   ALL fruits   Pumpkin Flavor      Med Rec must be completed prior to using this [place]  Discharge home in stable condition Infant Feeding: {Baby feeding:23562} Infant Disposition:{CHL IP OB HOME WITH JXKAJJ:42320} Discharge instruction: per After Visit Summary and Postpartum booklet. Activity: Advance as tolerated. Pelvic rest for 6 weeks.  Diet: {OB QJIJ:79199579} Future Appointments: Future Appointments  Date Time Provider [place]  11/05/2022  2:55 PM Clarnce Flock, MD [facility]  11/20/2022  8:15 AM Caren Macadam, MD [facility]  11/20/2022  9:15 AM [facility]   Follow up Visit:  Message sent to [facility] by Autry-Lott on 11/01/2022  Please schedule this patient for a In person postpartum visit in 6 weeks with the following provider: MD and APP. Additional Postpartum F/U:BP check 1 week and 2nd degree perineal laceration   High risk pregnancy  complicated by:  preE w/ SF Delivery mode:  Vaginal, Spontaneous  Anticipated Birth Control:   Patch   11/01/2022 Simone Autry-Lott, DO

## 2022-11-01 NOTE — Anesthesia Procedure Notes (Signed)
Epidural Patient location during procedure: OB Start time: 11/01/2022 5:41 AM End time: 11/01/2022 6:01 AM  Staffing Anesthesiologist: Val Eagle, MD Performed: anesthesiologist   Preanesthetic Checklist Completed: patient identified, IV checked, risks and benefits discussed, monitors and equipment checked, pre-op evaluation and timeout performed  Epidural Patient position: sitting Prep: DuraPrep Patient monitoring: heart rate, continuous pulse ox and blood pressure Approach: midline Location: L4-L5 Injection technique: LOR saline  Needle:  Needle type: Tuohy  Needle gauge: 17 G Needle length: 9 cm Needle insertion depth: 6 cm Catheter type: closed end flexible Catheter size: 19 Gauge Catheter at skin depth: 11 cm Test dose: negative and 2% lidocaine with Epi 1:200 K  Assessment Events: blood not aspirated, injection not painful, no injection resistance, no paresthesia and negative IV test

## 2022-11-02 LAB — CBC
HCT: 25.3 % — ABNORMAL LOW (ref 36.0–46.0)
Hemoglobin: 8.1 g/dL — ABNORMAL LOW (ref 12.0–15.0)
MCH: 25.4 pg — ABNORMAL LOW (ref 26.0–34.0)
MCHC: 32 g/dL (ref 30.0–36.0)
MCV: 79.3 fL — ABNORMAL LOW (ref 80.0–100.0)
Platelets: 156 10*3/uL (ref 150–400)
RBC: 3.19 MIL/uL — ABNORMAL LOW (ref 3.87–5.11)
RDW: 19.8 % — ABNORMAL HIGH (ref 11.5–15.5)
WBC: 15.9 10*3/uL — ABNORMAL HIGH (ref 4.0–10.5)
nRBC: 0 % (ref 0.0–0.2)

## 2022-11-02 LAB — BASIC METABOLIC PANEL
Anion gap: 8 (ref 5–15)
BUN: 5 mg/dL — ABNORMAL LOW (ref 6–20)
CO2: 24 mmol/L (ref 22–32)
Calcium: 7.5 mg/dL — ABNORMAL LOW (ref 8.9–10.3)
Chloride: 99 mmol/L (ref 98–111)
Creatinine, Ser: 1.15 mg/dL — ABNORMAL HIGH (ref 0.44–1.00)
GFR, Estimated: >60 mL/min (ref >60)
Glucose, Bld: 113 mg/dL — ABNORMAL HIGH (ref 70–99)
Potassium: 4.3 mmol/L (ref 3.5–5.1)
Sodium: 131 mmol/L — ABNORMAL LOW (ref 135–145)

## 2022-11-02 MED ORDER — SODIUM CHLORIDE 0.9 % IV SOLN
500.0000 mg | Freq: Once | INTRAVENOUS | Status: AC
  Administered 2022-11-02: 500 mg via INTRAVENOUS
  Filled 2022-11-02: qty 25

## 2022-11-02 NOTE — Anesthesia Postprocedure Evaluation (Signed)
Anesthesia Post Note  Patient: Lori Silva  Procedure(s) Performed: AN AD HOC LABOR EPIDURAL     Patient location during evaluation: OB High Risk Anesthesia Type: Epidural Level of consciousness: awake, oriented and awake and alert Pain management: pain level controlled Vital Signs Assessment: post-procedure vital signs reviewed and stable Respiratory status: spontaneous breathing, respiratory function stable and nonlabored ventilation Cardiovascular status: stable Postop Assessment: no headache, adequate PO intake, able to ambulate, patient able to bend at knees and no apparent nausea or vomiting Anesthetic complications: no   No notable events documented.  Last Vitals:  Vitals:   11/02/22 0424 11/02/22 0832  BP: 110/62 112/64  Pulse: 86 80  Resp: 16 16  Temp: 36.6 C 36.7 C  SpO2: 98% 97%    Last Pain:  Vitals:   11/02/22 0832  TempSrc: Oral  PainSc:    Pain Goal: Patients Stated Pain Goal: 1 (11/01/22 2345)                 Arisha Gervais

## 2022-11-02 NOTE — Progress Notes (Signed)
Post Partum Day 1 Subjective: no complaints, up ad lib, tolerating PO, and minimal bleeding.   Objective: Blood pressure 110/62, pulse 86, temperature 97.9 F (36.6 C), temperature source Oral, resp. rate 16, height 5\' 2"  (1.575 m), weight 65.3 kg, last menstrual period 02/13/2022, SpO2 98 %, unknown if currently breastfeeding.  Physical Exam:  General: alert, cooperative, and no distress Lochia: appropriate Uterine Fundus: firm Incision: n/a DVT Evaluation: No cords or calf tenderness.  Recent Labs    11/01/22 2015 11/02/22 0432  HGB 10.2* 8.1*  HCT 31.7* 25.3*    Assessment/Plan: Postpartum - Contraception: Patch - MOF: Breast - Rh status: Pos - Rubella status: Immune - Dispo: Likely on PPD2 - Consults: None  Preeclampsia with severe features - Mag x24 hours postdelivery - turns off around 630 - Creatinine bumped to 1.15 but making excellent UOP - Nifed 30 and lasix initiated for this morning  Acute blood loss anemia  - Had small postpartum hemorrhage. Will give IV Iron  Neonatal - Doing well - Circumcision: Desires circ. Patient was awoken from sleep when I rounded so I did not yet consent her for circ.    LOS: 3 days   14/03/23 11/02/2022, 7:56 AM

## 2022-11-02 NOTE — Lactation Note (Signed)
This note was copied from a baby's chart. Lactation Consultation Note  Patient Name: Lori Silva [id number] Date: 11/02/2022 Reason for consult: Mother's request;Difficult latch;Primapara;Early term 20-38.6wks Age:19 hours Mom called for latch assist having trouble latching. Mom had baby in cradle position swaddled in 2 blankets trying to latch. That will not happen. Baby's temp has been low so baby needs to be swaddled. LC un-swaddled but left cover around baby just opened up the front so baby can be STS w/mom and obtain deep latch. Baby wrapped on outside w/nothing but face exposed.  Baby was hungry. He latched right away and started suckling great. LC flanged lips. Mom denied painful latch. Praised mom for having good colostrum and baby BF good.  Maternal Data Has patient been taught Hand Expression?: Yes Does the patient have breastfeeding experience prior to this delivery?: No  Feeding Mother's Current Feeding Choice: Breast Milk and Formula  LATCH Score Latch: Grasps breast easily, tongue down, lips flanged, rhythmical sucking.  Audible Swallowing: A few with stimulation  Type of Nipple: Everted at rest and after stimulation (short shaft)  Comfort (Breast/Nipple): Filling, red/small blisters or bruises, mild/mod discomfort (breast full feeling)  Hold (Positioning): Assistance needed to correctly position infant at breast and maintain latch.  LATCH Score: 7   Lactation Tools Discussed/Used Tools: Pump;Flanges Flange Size: 21 Breast pump type: Double-Electric Breast Pump Pump Education: Setup, frequency, and cleaning;Milk Storage Reason for Pumping: supplementation Pumping frequency: q3h  Interventions Interventions: Breast feeding basics reviewed;Adjust position;Assisted with latch;Support pillows;Skin to skin;Position options;Breast massage;Breast compression  Discharge    Consult Status Consult Status: Follow-up Date: 11/02/22 Follow-up type:  In-patient    [name], [place] 11/02/2022, 1:32 AM

## 2022-11-03 ENCOUNTER — Other Ambulatory Visit (HOSPITAL_COMMUNITY): Payer: Self-pay

## 2022-11-03 MED ORDER — NIFEDIPINE ER 30 MG PO TB24
30.0000 mg | ORAL_TABLET | Freq: Every day | ORAL | 0 refills | Status: DC
  Filled 2022-11-03: qty 30, 30d supply, fill #0

## 2022-11-03 MED ORDER — ACETAMINOPHEN 325 MG PO TABS
650.0000 mg | ORAL_TABLET | ORAL | 0 refills | Status: DC | PRN
  Filled 2022-11-03: qty 60, 5d supply, fill #0

## 2022-11-03 MED ORDER — IBUPROFEN 600 MG PO TABS
600.0000 mg | ORAL_TABLET | Freq: Four times a day (QID) | ORAL | 0 refills | Status: DC
  Filled 2022-11-03: qty 30, 8d supply, fill #0

## 2022-11-03 MED ORDER — FUROSEMIDE 20 MG PO TABS
20.0000 mg | ORAL_TABLET | Freq: Every day | ORAL | 0 refills | Status: DC
  Filled 2022-11-03: qty 4, 4d supply, fill #0

## 2022-11-03 NOTE — Lactation Note (Signed)
This note was copied from a baby's chart. Lactation Consultation Note  Patient Name: Boy Jaxyn Mestas [id number] Date: 11/03/2022 Reason for consult: Follow-up assessment;Early term 37-38.6wks;1st time breastfeeding Age:19 hours  P1, Baby [redacted]w[redacted]d.  Baby is breastfeeding and supplemented with formula.  Mother has not been pumping.  Suggest when she gives a bottle of formula, pump so her milk supply is stimulated.  Suggest calling for latch assistance. Feed on demand with cues.  Goal 8-12+ times per day after first 24 hrs.   Reviewed engorgement care and monitoring voids/stools.   Maternal Data Has patient been taught Hand Expression?: Yes Does the patient have breastfeeding experience prior to this delivery?: No  Feeding Mother's Current Feeding Choice: Breast Milk and Formula  Lactation Tools Discussed/Used Tools: Pump Flange Size: 21 Breast pump type: Double-Electric Breast Pump Reason for Pumping: stimulaton and supplementation  Interventions Interventions: Education;Breast feeding basics reviewed;DEBP  Discharge Discharge Education: Engorgement and breast care;Warning signs for feeding baby Pump: Personal;DEBP (Motif)  Consult Status Consult Status: Complete Date: 11/03/22    Dahlia Byes [facility] 11/03/2022, 9:56 AM

## 2022-11-04 NOTE — Progress Notes (Signed)
Progress Note   Date: 11/03/2022  Patient Name: Lori Silva        MRN#: [id number]  Review the patient's clinical findings supports the diagnosis of:   AKI due to Preeclampsia with severe features  Milas Hock, MD Attending Obstetrician & Gynecologist, [facility] for [facility], [facility]

## 2022-11-05 ENCOUNTER — Encounter: Payer: Medicaid Other | Admitting: Family Medicine

## 2022-11-05 ENCOUNTER — Ambulatory Visit (INDEPENDENT_AMBULATORY_CARE_PROVIDER_SITE_OTHER): Payer: Medicaid Other | Admitting: General Practice

## 2022-11-05 VITALS — BP 127/81 | HR 112 | Ht 62.0 in | Wt 123.0 lb

## 2022-11-05 DIAGNOSIS — Z0130 Encounter for examination of blood pressure without abnormal findings: Secondary | ICD-10-CM

## 2022-11-05 NOTE — Progress Notes (Signed)
Patient presents to office for BP check following vaginal delivery 12/2. She reports taking Lasix & Nifedipine as prescribed. She denies headaches, dizziness, or blurry vision. BP 127/81. Patient will return for pp visit on 1/3.  Chase Caller RN BSN 11/05/22

## 2022-11-20 ENCOUNTER — Other Ambulatory Visit: Payer: Self-pay

## 2022-11-20 ENCOUNTER — Encounter: Payer: Medicaid Other | Admitting: Family Medicine

## 2022-11-20 ENCOUNTER — Encounter: Payer: Self-pay | Admitting: Family Medicine

## 2022-12-02 NOTE — Progress Notes (Signed)
[facility] Part[facility] Visit Note  Lori Silva is a 22 y.o. G64P1001 female who presents for a postpartum visit. She is 4 weeks postpartum following a normal spontaneous vaginal delivery.  I have fully reviewed the prenatal and intrapartum course. The delivery was at 37.2 gestational weeks.  Anesthesia: epidural. Postpartum course has been uncomplicated. Baby is doing well-nursing well. Baby is feeding by breast. Bleeding staining only. Bowel function is normal. Bladder function is normal. Patient is not sexually active. Contraception method is Depo-Provera injections. Postpartum depression screening: negative.   The pregnancy intention screening data noted above was reviewed. Potential methods of contraception were discussed. The patient elected to proceed with No data recorded.   Edinburgh Postnatal Depression Scale - 12/03/22 1556       Edinburgh Postnatal Depression Scale:  In the Past 7 Days   I have been able to laugh and see the funny side of things. 0    I have looked forward with enjoyment to things. 0    I have blamed myself unnecessarily when things went wrong. 0    I have been anxious or worried for no good reason. 0    I have felt scared or panicky for no good reason. 0    Things have been getting on top of me. 0    I have been so unhappy that I have had difficulty sleeping. 0    I have felt sad or miserable. 1    I have been so unhappy that I have been crying. 0    The thought of harming myself has occurred to me. 0    Edinburgh Postnatal Depression Scale Total 1             There are no preventive care reminders to display for this patient.  The following portions of the patient's history were reviewed and updated as appropriate: allergies, current medications, past family history, past medical history, past social history, past surgical history, and problem list.  Review of Systems Pertinent items are noted in HPI.  Objective:  BP 131/89   Pulse (!) 111   Ht 5\' 2"   (1.575 m)   Wt 119 lb (54 kg)   LMP  (LMP Unknown)   Breastfeeding Yes   BMI 21.77 kg/m    General:  alert, cooperative, and appears stated age   Breasts:  not indicated  Lungs: clear to auscultation bilaterally  Heart:  regular rate and rhythm, S1, S2 normal, no murmur, click, rub or gallop  Abdomen: soft, non-tender; bowel sounds normal; no masses,  no organomegaly   Wound NA  GU exam:  not indicated       Assessment:   Normal postpartum exam.   Plan:   Essential components of care per ACOG recommendations:  1.  Mood and well being: Patient with negative depression screening today. Reviewed local resources for support.  - Patient tobacco use? No.   - hx of drug use? No.    2. Infant care and feeding:  -Patient currently breastmilk feeding? Yes. Reviewed importance of draining breast regularly to support lactation.  -Social determinants of health (SDOH) reviewed in EPIC. No concern 3. Sexuality, contraception and birth spacing - Patient does not want a pregnancy in the next year.  Desired family size is 2 children.  - Reviewed reproductive life planning. Reviewed contraceptive methods based on pt preferences and effectiveness.  Patient desired Hormonal Injection today.   - Discussed birth spacing of 18 months  4. Sleep and fatigue -  Encouraged family/partner/community support of 4 hrs of uninterrupted sleep to help with mood and fatigue  5. Physical Recovery  - Discussed patients delivery and complications. She describes her labor as good. - Patient had a Vaginal, no problems at delivery. Patient had a 2nd degree laceration. Perineal healing reviewed. Patient expressed understanding - Patient has urinary incontinence? No. - Patient is safe to resume physical and sexual activity  6.  Health Maintenance - HM due items addressed Yes - Last pap smear No results found for: "DIAGPAP" Pap smear not done at today's visit.  -Breast Cancer screening indicated? No.   7. Chronic  Disease/Pregnancy Condition follow up: None - Anemia after delivery-- plan on recheck at 86months - Severe PEC- referral to [facility] today  Caren Macadam, [place] for [facility], [place]

## 2022-12-03 ENCOUNTER — Ambulatory Visit (INDEPENDENT_AMBULATORY_CARE_PROVIDER_SITE_OTHER): Payer: Medicaid Other | Admitting: Family Medicine

## 2022-12-03 ENCOUNTER — Encounter: Payer: Self-pay | Admitting: Family Medicine

## 2022-12-03 DIAGNOSIS — Z3042 Encounter for surveillance of injectable contraceptive: Secondary | ICD-10-CM

## 2022-12-03 DIAGNOSIS — O1413 Severe pre-eclampsia, third trimester: Secondary | ICD-10-CM

## 2022-12-03 MED ORDER — MEDROXYPROGESTERONE ACETATE 150 MG/ML IM SUSP
150.0000 mg | Freq: Once | INTRAMUSCULAR | Status: AC
  Administered 2022-12-03: 150 mg via INTRAMUSCULAR

## 2022-12-05 ENCOUNTER — Encounter: Payer: Self-pay | Admitting: Family Medicine

## 2023-01-05 ENCOUNTER — Encounter: Payer: Self-pay | Admitting: Family Medicine

## 2023-01-06 ENCOUNTER — Encounter: Payer: Self-pay | Admitting: Family Medicine

## 2023-01-09 ENCOUNTER — Encounter: Payer: Self-pay | Admitting: Family Medicine

## 2023-01-09 ENCOUNTER — Ambulatory Visit (INDEPENDENT_AMBULATORY_CARE_PROVIDER_SITE_OTHER): Payer: Medicaid Other | Admitting: Family Medicine

## 2023-01-09 VITALS — BP 121/83 | HR 97 | Ht 62.0 in | Wt 118.0 lb

## 2023-01-09 DIAGNOSIS — Z0184 Encounter for antibody response examination: Secondary | ICD-10-CM

## 2023-01-09 NOTE — Progress Notes (Signed)
Needs MMR immunity testing for school requirement, testing ordered

## 2023-01-10 LAB — MEASLES/MUMPS/RUBELLA IMMUNITY
MUMPS ABS, IGG: <9 AU/mL — ABNORMAL LOW (ref >10.9)
RUBEOLA AB, IGG: 240 AU/mL (ref >16.4)
Rubella Antibodies, IGG: 3.28 index (ref >0.99)

## 2023-03-19 ENCOUNTER — Encounter: Payer: Self-pay | Admitting: Family Medicine

## 2023-05-19 ENCOUNTER — Ambulatory Visit: Payer: Medicaid Other

## 2023-05-20 ENCOUNTER — Ambulatory Visit (INDEPENDENT_AMBULATORY_CARE_PROVIDER_SITE_OTHER): Payer: Medicaid Other | Admitting: General Practice

## 2023-05-20 ENCOUNTER — Other Ambulatory Visit: Payer: Self-pay

## 2023-05-20 VITALS — BP 130/87 | HR 77 | Ht 62.0 in | Wt 122.0 lb

## 2023-05-20 DIAGNOSIS — Z3042 Encounter for surveillance of injectable contraceptive: Secondary | ICD-10-CM | POA: Diagnosis not present

## 2023-05-20 LAB — POCT PREGNANCY, URINE: Preg Test, Ur: NEGATIVE

## 2023-05-20 MED ORDER — MEDROXYPROGESTERONE ACETATE 150 MG/ML IM SUSY
150.0000 mg | PREFILLED_SYRINGE | Freq: Once | INTRAMUSCULAR | Status: AC
  Administered 2023-05-20: 150 mg via INTRAMUSCULAR

## 2023-05-20 NOTE — Progress Notes (Signed)
Lori Silva here for Depo-Provera Injection. Patient is past due for depo provera- upt collected, UPT -. Injection administered without complication. Patient will return in 3 months for next injection between 9/4 and 9/18. Next annual visit due January 2025.   Marylynn Pearson, RN 05/20/2023  2:41 PM

## 2023-06-29 ENCOUNTER — Ambulatory Visit: Payer: Medicaid Other

## 2023-07-01 ENCOUNTER — Ambulatory Visit (INDEPENDENT_AMBULATORY_CARE_PROVIDER_SITE_OTHER): Payer: Medicaid Other | Admitting: *Deleted

## 2023-07-01 ENCOUNTER — Other Ambulatory Visit (HOSPITAL_COMMUNITY)
Admission: RE | Admit: 2023-07-01 | Discharge: 2023-07-01 | Disposition: A | Discharge status: Home / Self Care | Payer: Medicaid Other | Source: Ambulatory Visit | Attending: Family Medicine | Admitting: Family Medicine

## 2023-07-01 ENCOUNTER — Other Ambulatory Visit: Payer: Self-pay

## 2023-07-01 VITALS — BP 134/74 | HR 73 | Ht 62.0 in | Wt 117.0 lb

## 2023-07-01 DIAGNOSIS — Z113 Encounter for screening for infections with a predominantly sexual mode of transmission: Secondary | ICD-10-CM | POA: Diagnosis present

## 2023-07-01 NOTE — Progress Notes (Signed)
Pt presents with request for STI testing - including testing for Syphilis and HIV. She stated that her partner cheated on her. She denies abdominal pain, vaginal discharge or odor. Self swab was obtained as well as lab draw.  Pt was advised that she will ne notified of test results and treatment needed if any via Mychart. She voiced understanding.

## 2023-07-08 ENCOUNTER — Telehealth: Payer: Self-pay | Admitting: Lactation Services

## 2023-07-08 ENCOUNTER — Other Ambulatory Visit: Payer: Self-pay | Admitting: Family Medicine

## 2023-07-08 MED ORDER — FLUCONAZOLE 150 MG PO TABS: 150.0000 mg | ORAL_TABLET | Freq: Once | ORAL | 0 refills | Status: AC

## 2023-07-08 MED ORDER — METRONIDAZOLE 500 MG PO TABS: 500.0000 mg | ORAL_TABLET | Freq: Two times a day (BID) | ORAL | 0 refills | Status: DC

## 2023-07-08 NOTE — Telephone Encounter (Signed)
Called patient to inform her of + BV and Yeast. She did not answer. LM for her to check her Mychart message and that RX will be sent to Pharmacy. Asked her to call the office with any other questions or concerns.

## 2023-07-29 ENCOUNTER — Telehealth: Payer: Self-pay | Admitting: Lactation Services

## 2023-07-29 NOTE — Telephone Encounter (Signed)
Per chart review, Flagyl was sent to Patient's pharmacy on 8/7. Per patient she has tried to puck up 3-4 times and was told there was not a prescription.   Called and spoke with Pharmacy, they report they have it on file, and reports it has not been filled. Asked them to get it filled and will let the patient know it is being filled.

## 2023-08-05 ENCOUNTER — Ambulatory Visit: Payer: Medicaid Other

## 2023-09-22 IMAGING — CR DG RIBS W/ CHEST 3+V*L*
3 series · 3 of 3 positions shown · non-contrast
Comparison: Chest radiograph 04/11/2020

CLINICAL DATA: Fell off dorm room bed, bruising to LEFT ribs, hurts
to cough for laugh, history asthma

EXAM:
LEFT RIBS AND CHEST - 3+ VIEW

[w chest pa]
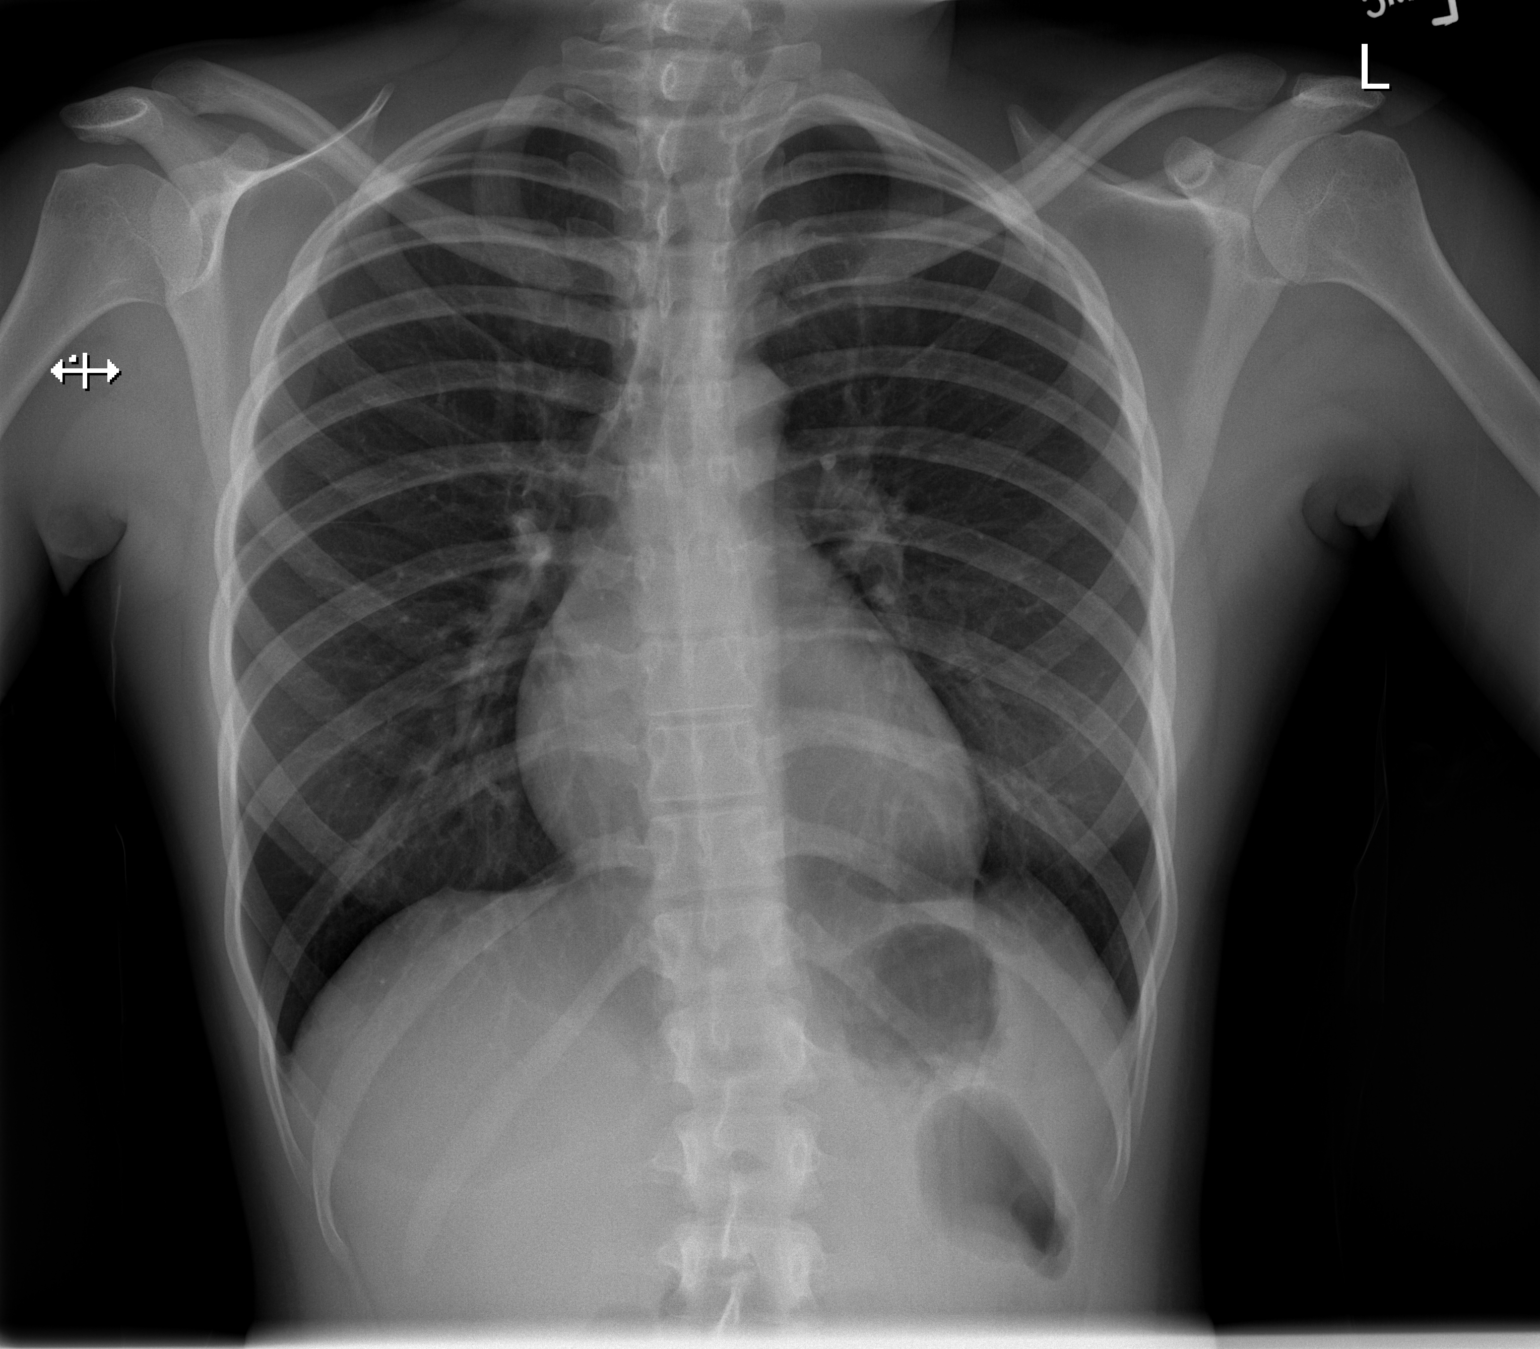

[w ribs ap upper left]
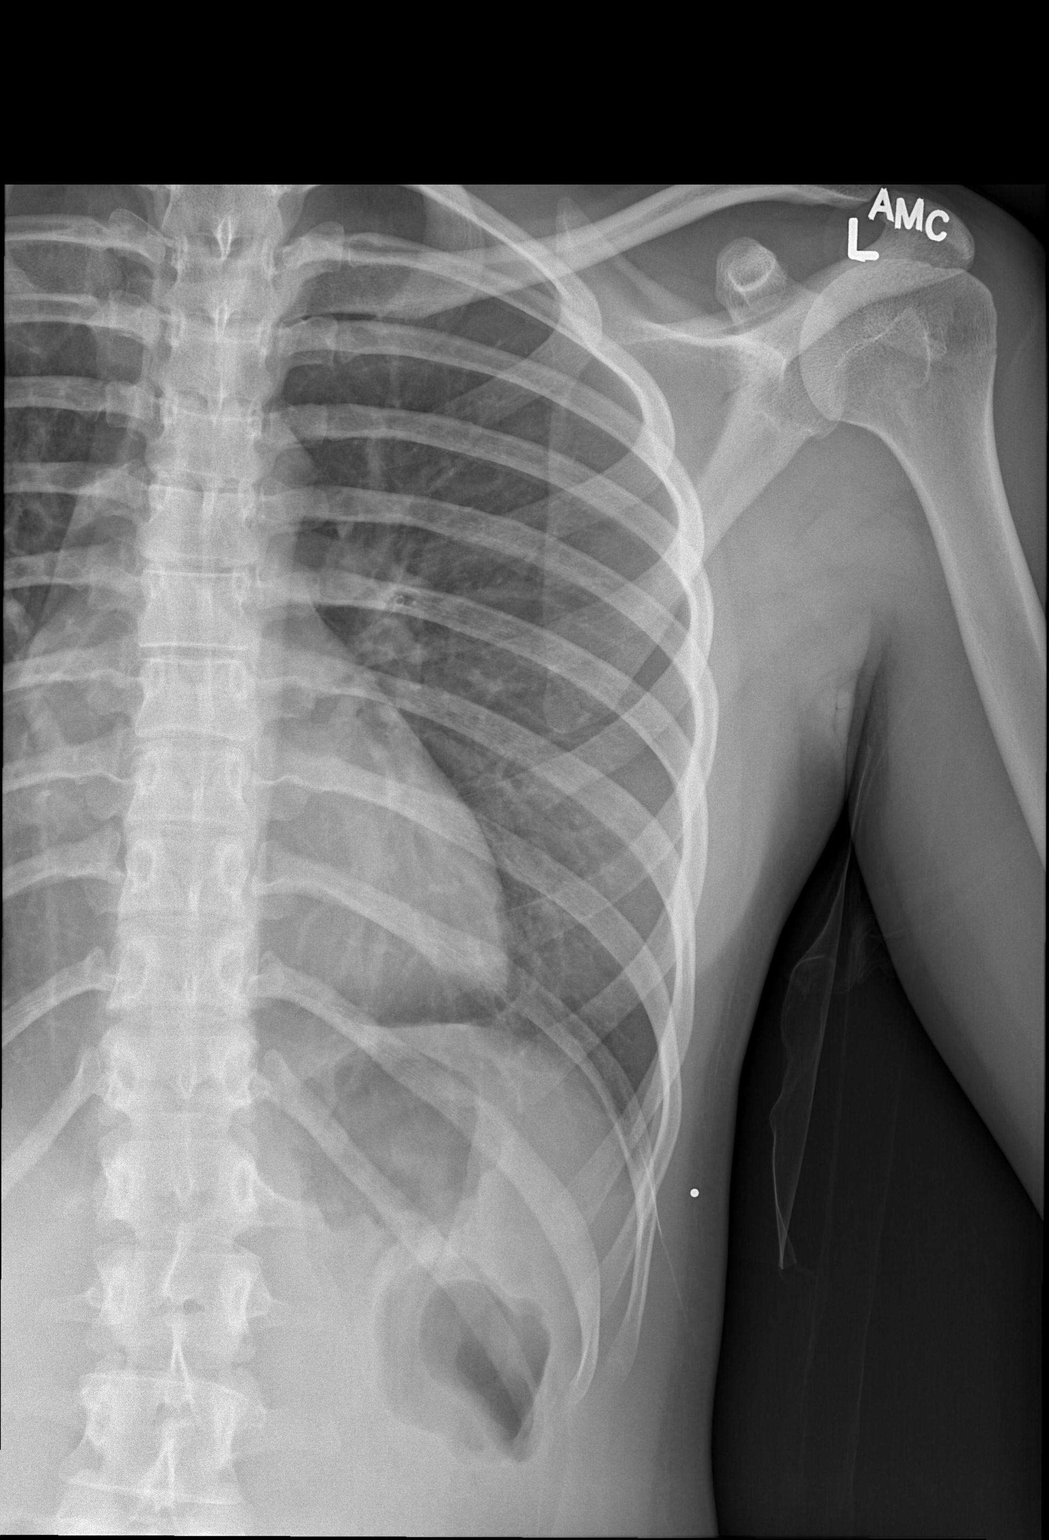

[w ribs obl left]
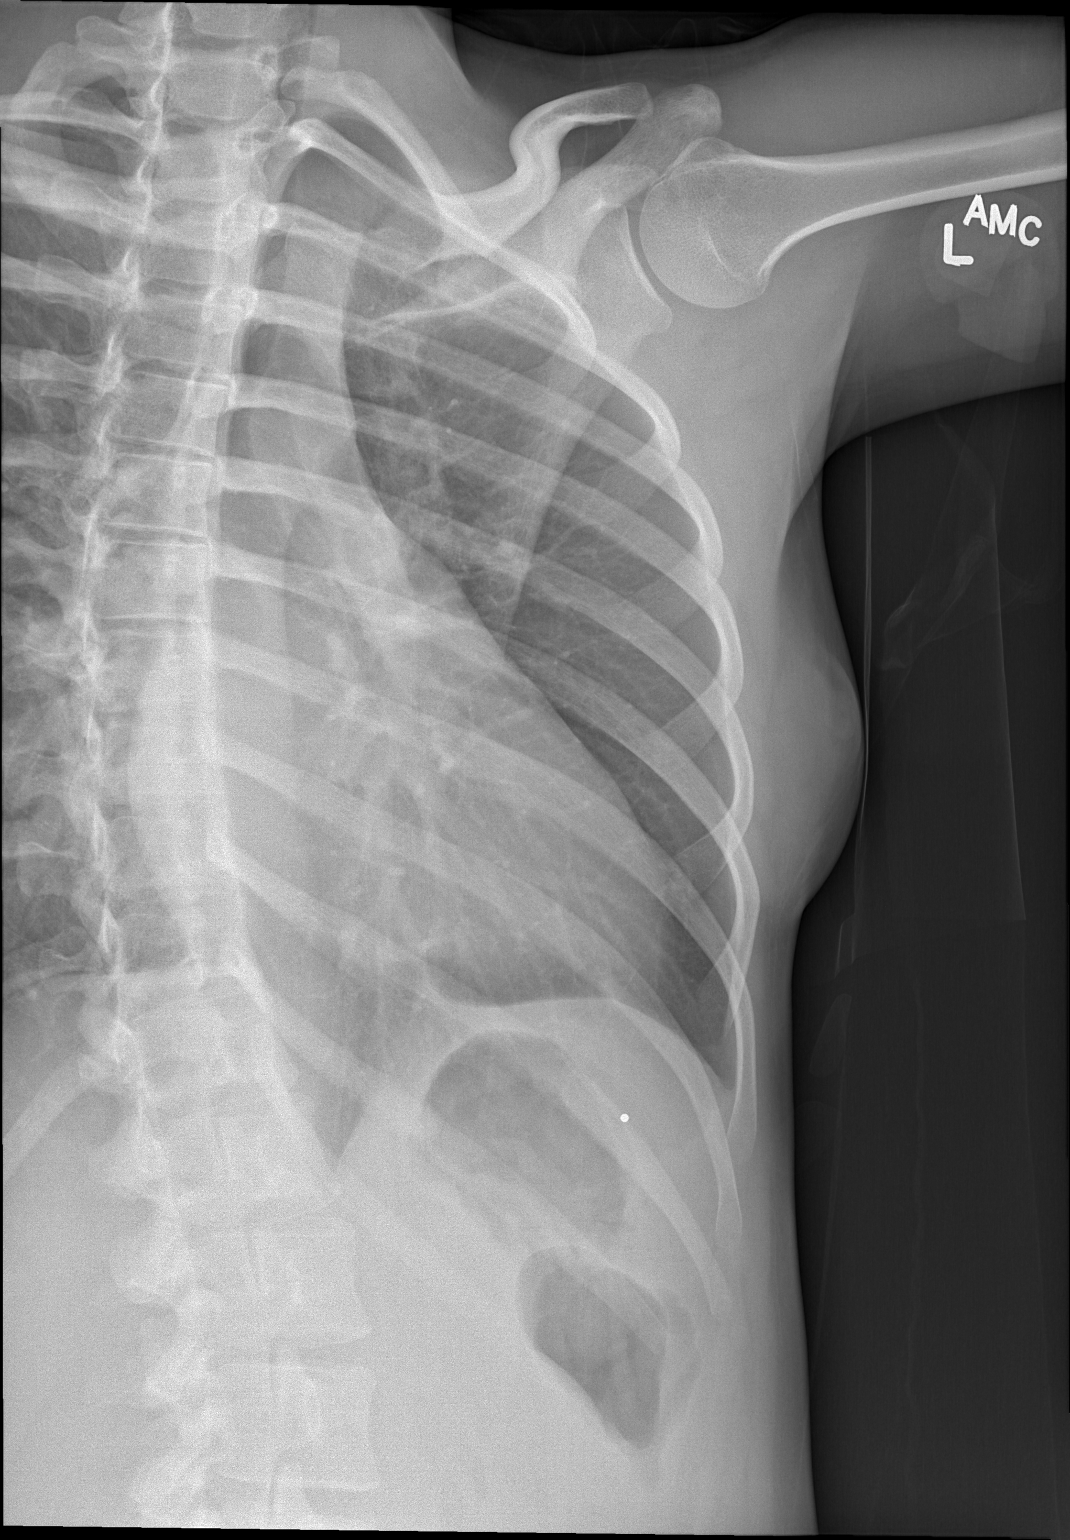

[3 of 3 positions shown; findings below may reference images not displayed]

FINDINGS: Normal heart size, mediastinal contours, and pulmonary vascularity.

Lungs clear.

No pulmonary infiltrate, pleural effusion, or pneumothorax.

BB placed at site of symptoms lower LEFT ribs.

No rib fracture or bone destruction.

Mild broad-based dextroconvex thoracic scoliosis.
IMPRESSION: No acute abnormalities.

## 2024-06-14 ENCOUNTER — Ambulatory Visit

## 2024-06-24 ENCOUNTER — Encounter: Payer: Self-pay | Admitting: Family Medicine

## 2024-07-11 ENCOUNTER — Ambulatory Visit: Admitting: Family Medicine

## 2024-07-18 ENCOUNTER — Encounter: Admitting: Family Medicine

## 2024-11-16 ENCOUNTER — Encounter: Payer: Self-pay | Admitting: Family Medicine

## 2024-12-06 ENCOUNTER — Encounter: Payer: Self-pay | Admitting: Family Medicine

## 2024-12-21 ENCOUNTER — Encounter: Admitting: Family Medicine

## 2025-01-04 ENCOUNTER — Ambulatory Visit: Admitting: Family Medicine

## 2025-01-04 ENCOUNTER — Other Ambulatory Visit: Payer: Self-pay

## 2025-01-04 VITALS — BP 122/83 | HR 103 | Wt 120.5 lb

## 2025-01-04 DIAGNOSIS — Z3042 Encounter for surveillance of injectable contraceptive: Secondary | ICD-10-CM

## 2025-01-04 DIAGNOSIS — N939 Abnormal uterine and vaginal bleeding, unspecified: Secondary | ICD-10-CM | POA: Insufficient documentation

## 2025-01-04 DIAGNOSIS — Z308 Encounter for other contraceptive management: Secondary | ICD-10-CM

## 2025-01-04 DIAGNOSIS — R5383 Other fatigue: Secondary | ICD-10-CM | POA: Insufficient documentation

## 2025-01-04 LAB — POCT PREGNANCY, URINE: Preg Test, Ur: NEGATIVE

## 2025-01-04 MED ORDER — MEDROXYPROGESTERONE ACETATE 150 MG/ML IM SUSP
150.0000 mg | Freq: Once | INTRAMUSCULAR | Status: AC
  Administered 2025-01-04: 150 mg via INTRAMUSCULAR

## 2025-01-06 ENCOUNTER — Other Ambulatory Visit: Payer: Self-pay

## 2025-01-06 DIAGNOSIS — Z308 Encounter for other contraceptive management: Secondary | ICD-10-CM | POA: Insufficient documentation

## 2025-01-06 DIAGNOSIS — Z3042 Encounter for surveillance of injectable contraceptive: Secondary | ICD-10-CM | POA: Insufficient documentation

## 2025-01-06 NOTE — Assessment & Plan Note (Signed)
 Lengthy discussion regarding contraceptive management.  Patient is considering other forms of birth control such as an IUD.  She will start with Depo today but may decide to return for IUD placement.  Had a lengthy discussion with her about the fact that she needs a Pap smear but she request female provider.  Will have her rescheduled for this appointment as well.

## 2025-01-06 NOTE — Assessment & Plan Note (Signed)
 Given Depo today

## 2025-01-06 NOTE — Progress Notes (Signed)
" ° ° °  Subjective:  Lori Silva is a 24 y.o. female who presents to the clinic today for evaluation to discuss birth control and possibly get back on it.  HPI: Contraceptive management Patient presenting to discuss contraceptive management.  Has previously been on Depo which she liked.  Has also been on the patch before and reports that although she likes how it works she did not like some of the side effects and did not like it being visible in the summer.  Has not consider other forms of birth control.  Does report that she smokes tobacco products occasionally.  Objective:  Physical Exam: BP 122/83   Pulse (!) 103   Wt 120 lb 8 oz (54.7 kg)   BMI 22.04 kg/m   Gen: Alert, well-appearing, no acute distress CV: Regular rate and rhythm, no murmurs appreciated Pulm: Normal work of breathing, speaking in full sentences GI: Soft, nontender MSK: no edema, cyanosis, or clubbing noted Skin: warm, dry Neuro: grossly normal, moves all extremities Psych: Normal affect and thought content  Results for orders placed or performed in visit on 01/04/25 (from the past 72 hours)  Pregnancy, urine POC     Status: None   Collection Time: 01/04/25 11:44 AM  Result Value Ref Range   Preg Test, Ur NEGATIVE NEGATIVE    Comment:        THE SENSITIVITY OF THIS METHODOLOGY IS >20 mIU/mL.      Assessment/Plan:  Surveillance for Depo-Provera  contraception Given Depo today  Encounter for other contraceptive management Lengthy discussion regarding contraceptive management.  Patient is considering other forms of birth control such as an IUD.  She will start with Depo today but may decide to return for IUD placement.  Had a lengthy discussion with her about the fact that she needs a Pap smear but she request female provider.  Will have her rescheduled for this appointment as well.   Lab Orders         Pregnancy, urine POC     Meds ordered this encounter  Medications   medroxyPROGESTERone   (DEPO-PROVERA ) injection 150 mg      Victor Onesty Clair, MD Attending Family Medicine Physician, [facility] for [facility], [facility]   01/06/25 7:57 AM  "

## 2025-01-18 ENCOUNTER — Encounter: Admitting: Family Medicine

## 2025-03-22 ENCOUNTER — Ambulatory Visit: Payer: Self-pay
